# Patient Record
Sex: Female | Born: 1980
Health system: Southern US, Community
[De-identification: ages and names within clinical notes are randomized; demographics above are authoritative.]

## PROBLEM LIST (undated history)

## (undated) DIAGNOSIS — G43909 Migraine, unspecified, not intractable, without status migrainosus: Secondary | ICD-10-CM

## (undated) DIAGNOSIS — I10 Essential (primary) hypertension: Secondary | ICD-10-CM

## (undated) DIAGNOSIS — A6000 Herpesviral infection of urogenital system, unspecified: Secondary | ICD-10-CM

## (undated) HISTORY — PX: TUBAL LIGATION: SHX77

## (undated) HISTORY — DX: Herpesviral infection of urogenital system, unspecified: A60.00

---

## 2001-01-10 ENCOUNTER — Inpatient Hospital Stay (HOSPITAL_COMMUNITY): Admission: AD | Admit: 2001-01-10 | Discharge: 2001-01-13 | Payer: Self-pay | Admitting: Obstetrics and Gynecology

## 2001-02-19 ENCOUNTER — Other Ambulatory Visit: Admission: RE | Admit: 2001-02-19 | Discharge: 2001-02-19 | Payer: Self-pay | Admitting: Obstetrics and Gynecology

## 2002-02-12 ENCOUNTER — Encounter: Payer: Self-pay | Admitting: Emergency Medicine

## 2002-02-12 ENCOUNTER — Emergency Department (HOSPITAL_COMMUNITY): Admission: EM | Admit: 2002-02-12 | Discharge: 2002-02-12 | Payer: Self-pay | Admitting: Emergency Medicine

## 2002-03-04 ENCOUNTER — Other Ambulatory Visit: Admission: RE | Admit: 2002-03-04 | Discharge: 2002-03-04 | Payer: Self-pay | Admitting: Obstetrics and Gynecology

## 2002-03-15 ENCOUNTER — Inpatient Hospital Stay (HOSPITAL_COMMUNITY): Admission: AD | Admit: 2002-03-15 | Discharge: 2002-03-15 | Payer: Self-pay | Admitting: Obstetrics and Gynecology

## 2002-09-02 ENCOUNTER — Inpatient Hospital Stay (HOSPITAL_COMMUNITY): Admission: AD | Admit: 2002-09-02 | Discharge: 2002-09-04 | Payer: Self-pay | Admitting: Obstetrics and Gynecology

## 2003-08-19 ENCOUNTER — Other Ambulatory Visit: Admission: RE | Admit: 2003-08-19 | Discharge: 2003-08-19 | Payer: Self-pay | Admitting: Obstetrics and Gynecology

## 2004-02-29 ENCOUNTER — Inpatient Hospital Stay (HOSPITAL_COMMUNITY): Admission: AD | Admit: 2004-02-29 | Discharge: 2004-02-29 | Payer: Self-pay | Admitting: Obstetrics and Gynecology

## 2004-04-11 ENCOUNTER — Inpatient Hospital Stay (HOSPITAL_COMMUNITY): Admission: AD | Admit: 2004-04-11 | Discharge: 2004-04-13 | Payer: Self-pay | Admitting: Obstetrics and Gynecology

## 2004-04-12 ENCOUNTER — Encounter (INDEPENDENT_AMBULATORY_CARE_PROVIDER_SITE_OTHER): Payer: Self-pay | Admitting: *Deleted

## 2006-09-08 ENCOUNTER — Inpatient Hospital Stay (HOSPITAL_COMMUNITY): Admission: AD | Admit: 2006-09-08 | Discharge: 2006-09-09 | Payer: Self-pay | Admitting: Obstetrics and Gynecology

## 2007-03-02 ENCOUNTER — Emergency Department (HOSPITAL_COMMUNITY): Admission: EM | Admit: 2007-03-02 | Discharge: 2007-03-02 | Payer: Self-pay | Admitting: Emergency Medicine

## 2007-08-30 ENCOUNTER — Inpatient Hospital Stay (HOSPITAL_COMMUNITY): Admission: AD | Admit: 2007-08-30 | Discharge: 2007-08-30 | Payer: Self-pay | Admitting: Family Medicine

## 2008-07-28 ENCOUNTER — Inpatient Hospital Stay (HOSPITAL_COMMUNITY): Admission: AD | Admit: 2008-07-28 | Discharge: 2008-07-28 | Payer: Self-pay | Admitting: Obstetrics & Gynecology

## 2008-07-29 ENCOUNTER — Inpatient Hospital Stay (HOSPITAL_COMMUNITY): Admission: AD | Admit: 2008-07-29 | Discharge: 2008-07-29 | Payer: Self-pay | Admitting: Obstetrics & Gynecology

## 2010-05-10 LAB — URINE MICROSCOPIC-ADD ON

## 2010-05-10 LAB — URINALYSIS, ROUTINE W REFLEX MICROSCOPIC
Bilirubin Urine: NEGATIVE
Glucose, UA: NEGATIVE mg/dL
Hgb urine dipstick: NEGATIVE
Ketones, ur: NEGATIVE mg/dL
Nitrite: POSITIVE — AB
Protein, ur: NEGATIVE mg/dL
Specific Gravity, Urine: >1.03 — ABNORMAL HIGH (ref 1.005–1.030)
Urobilinogen, UA: 1 mg/dL (ref 0.0–1.0)
pH: 6 (ref 5.0–8.0)

## 2010-05-10 LAB — CBC
Hemoglobin: 14 g/dL (ref 12.0–15.0)
MCHC: 35.3 g/dL (ref 30.0–36.0)
MCV: 92 fL (ref 78.0–100.0)
RBC: 4.32 MIL/uL (ref 3.87–5.11)
RDW: 12.8 % (ref 11.5–15.5)

## 2010-05-10 LAB — GC/CHLAMYDIA PROBE AMP, GENITAL
Chlamydia, DNA Probe: POSITIVE — AB
GC Probe Amp, Genital: NEGATIVE

## 2010-05-10 LAB — WET PREP, GENITAL: Clue Cells Wet Prep HPF POC: NONE SEEN

## 2010-05-10 LAB — POCT PREGNANCY, URINE: Preg Test, Ur: NEGATIVE

## 2010-06-18 NOTE — Op Note (Signed)
[name], [name]               ACCOUNT NO.:  [id number]   MEDICAL RECORD NO.:  [id number]          PATIENT TYPE:  INP   LOCATION:  9102                          FACILITY:  [facility]   PHYSICIAN:  James A. Ashley Royalty, M.D.DATE OF BIRTH:  [birth year]   DATE OF PROCEDURE:  04/12/2004  DATE OF DISCHARGE:                                 OPERATIVE REPORT   PREOPERATIVE DIAGNOSIS:  Desire for attempt at permanent sterilization.   POSTOPERATIVE DIAGNOSIS:  Desire for attempt at permanent sterilization,  pathology pending.   PROCEDURE:  Bilateral tubal sterilization procedure (modified [place]).   SURGEON:  Rudy Jew. Ashley Royalty, M.D.   ANESTHESIA:  Epidural.   ESTIMATED BLOOD LOSS:  Less than 10 mL.   COMPLICATIONS:  None.   PACKS AND DRAINS:  None.   PROCEDURE:  The patient was taken to the operating room and placed in the  dorsal supine position.  Epidural anesthetic was administered to surgical  levels.  She was prepped and draped in the usual manner for abdominal  surgery.  A 2 cm infraumbilical incision was made.  The subcutaneous tissues  were sharply dissected down to the fascia, which was elevated with hemostats  and entered atraumatically with Mayo scissors.  The incision was extended  laterally.  The peritoneum was then encountered and elevated with hemostats  and entered atraumatically with Metzenbaum scissors.  S retractors were used  to expose the right fallopian tube, which was grasped with Babcock clamps  and traced to its fimbriated end.  An avascular area of the ampullary  portion of the tube was chosen for ligation.  A #1 plain catgut ligature was  securely tied around this knuckle of tube.  A second ligature was tied  securely as well.  The intervening portion of the fallopian tube was excised  with Metzenbaum scissors and sent to pathology for histologic studies.  Hemostasis was noted and the remainder of the fallopian tube allowed to  return to the abdominal cavity.  The  left fallopian tube was then identified  and elevated with Babcock clamps.  An avascular area at the ampullary area  was chosen for ligation.  A #1 plain ligature was tied securely around the  designated portion of the fallopian tube.  A second portion was tied as  well.  The intervening portion of fallopian tube was excised with Metzenbaum  scissors and sent to pathology for histologic studies.  Hemostasis was  noted.   At this point the fascia was closed with 0 Vicryl in a running fashion.  The  skin was closed with 3-0 Monocryl in a subcuticular fashion.  Hemostasis was  noted and the procedure terminated.   The patient tolerated the procedure extremely well and was returned to the  recovery room in excellent condition.      JAM/MEDQ  D:  04/12/2004  T:  04/12/2004  Job:  [id number]

## 2010-10-29 LAB — WET PREP, GENITAL: Yeast Wet Prep HPF POC: NONE SEEN

## 2010-10-29 LAB — URINALYSIS, ROUTINE W REFLEX MICROSCOPIC
Glucose, UA: NEGATIVE
Ketones, ur: NEGATIVE
Protein, ur: NEGATIVE
Urobilinogen, UA: 2 — ABNORMAL HIGH

## 2010-10-29 LAB — URINE MICROSCOPIC-ADD ON

## 2010-10-29 LAB — DIFFERENTIAL
Lymphocytes Relative: 49 — ABNORMAL HIGH
Lymphs Abs: 2.1
Monocytes Relative: 10
Neutro Abs: 1.5 — ABNORMAL LOW
Neutrophils Relative %: 35 — ABNORMAL LOW

## 2010-10-29 LAB — CBC
Hemoglobin: 13.4
RBC: 4.15
WBC: 4.3

## 2010-10-29 LAB — GC/CHLAMYDIA PROBE AMP, GENITAL: Chlamydia, DNA Probe: NEGATIVE

## 2010-10-29 LAB — URINE CULTURE

## 2010-11-15 LAB — URINALYSIS, ROUTINE W REFLEX MICROSCOPIC
Glucose, UA: NEGATIVE
Protein, ur: NEGATIVE
Specific Gravity, Urine: >1.03 — ABNORMAL HIGH
pH: 6

## 2010-11-15 LAB — WET PREP, GENITAL: Clue Cells Wet Prep HPF POC: NONE SEEN

## 2010-11-15 LAB — URINE CULTURE

## 2010-11-15 LAB — URINE MICROSCOPIC-ADD ON

## 2010-11-15 LAB — GC/CHLAMYDIA PROBE AMP, GENITAL: GC Probe Amp, Genital: NEGATIVE

## 2011-05-12 ENCOUNTER — Inpatient Hospital Stay (HOSPITAL_COMMUNITY)
Admission: AD | Admit: 2011-05-12 | Discharge: 2011-05-12 | Disposition: A | Discharge status: Home / Self Care | Payer: Self-pay | Source: Ambulatory Visit | Attending: Obstetrics and Gynecology | Admitting: Obstetrics and Gynecology

## 2011-05-12 DIAGNOSIS — Z3202 Encounter for pregnancy test, result negative: Secondary | ICD-10-CM | POA: Insufficient documentation

## 2011-05-12 DIAGNOSIS — N912 Amenorrhea, unspecified: Secondary | ICD-10-CM | POA: Insufficient documentation

## 2011-05-12 LAB — POCT PREGNANCY, URINE: Preg Test, Ur: NEGATIVE

## 2011-05-12 LAB — URINALYSIS, ROUTINE W REFLEX MICROSCOPIC
Glucose, UA: NEGATIVE mg/dL
pH: 7.5 (ref 5.0–8.0)

## 2011-05-12 LAB — URINE MICROSCOPIC-ADD ON

## 2011-05-12 NOTE — MAU Note (Signed)
Pt states had 3 positive upt's. Here for clarification. Denies pain, bleeding or abnormal vaginal discharge.

## 2011-05-12 NOTE — MAU Provider Note (Signed)
  History     CSN: [id number]  Arrival date and time: 05/12/11 1412   None     Chief Complaint  Patient presents with  . Amenorrhea   HPI Gina [name] is 31 y.o. presents with request for confirmation of pregnancy.   She had 3 + pregnancy tests at home.  LMP 1 week ago that was shorter than normal.   She has been trying to conceive for 2 months.  She denies pelvic pain.   No past medical history on file.  No past surgical history on file.  No family history on file.  History  Substance Use Topics  . Smoking status: Not on file  . Smokeless tobacco: Not on file  . Alcohol Use: Not on file    Allergies: Allergies not on file  No prescriptions prior to admission    Review of Systems  Constitutional: Negative.   Gastrointestinal: Negative for abdominal pain.  Genitourinary: Negative.   Psychiatric/Behavioral: Negative.    Physical Exam   Blood pressure 125/83, pulse 81, temperature 98.5 F (36.9 C), temperature source Oral, resp. rate 16, height 5' 5.5" (1.664 m), weight 94.065 kg (207 lb 6 oz), last menstrual period 03/31/2011, SpO2 100.00%.  Physical Exam  Constitutional: She is oriented to person, place, and time. She appears well-developed and well-nourished. No distress.  HENT:  Head: Normocephalic.  Genitourinary:       Not indicated  Neurological: She is alert and oriented to person, place, and time.  Skin: Skin is warm and dry.  Psychiatric: She has a normal mood and affect. Her behavior is normal.    MAU Course  Procedures  MDM   Assessment and Plan  A:  Abnormally short LMP       Negative UPT  P:  Patient instructed to repeat pregnancy test if she misses her next cycle.   Xayla Puzio,EVE M 05/12/2011, 3:49 PM

## 2011-05-16 NOTE — MAU Provider Note (Signed)
Agree with above note.  Ryett Hamman 05/16/2011 1:34 PM

## 2013-05-20 ENCOUNTER — Inpatient Hospital Stay (HOSPITAL_COMMUNITY)
Admission: AD | Admit: 2013-05-20 | Discharge: 2013-05-20 | Disposition: A | Discharge status: Home / Self Care | Payer: Self-pay | Source: Ambulatory Visit | Attending: Obstetrics & Gynecology | Admitting: Obstetrics & Gynecology

## 2013-05-20 DIAGNOSIS — H6520 Chronic serous otitis media, unspecified ear: Secondary | ICD-10-CM | POA: Insufficient documentation

## 2013-05-20 DIAGNOSIS — R030 Elevated blood-pressure reading, without diagnosis of hypertension: Secondary | ICD-10-CM | POA: Insufficient documentation

## 2013-05-20 DIAGNOSIS — IMO0001 Reserved for inherently not codable concepts without codable children: Secondary | ICD-10-CM

## 2013-05-20 DIAGNOSIS — H6590 Unspecified nonsuppurative otitis media, unspecified ear: Secondary | ICD-10-CM

## 2013-05-20 DIAGNOSIS — B009 Herpesviral infection, unspecified: Secondary | ICD-10-CM | POA: Insufficient documentation

## 2013-05-20 DIAGNOSIS — B001 Herpesviral vesicular dermatitis: Secondary | ICD-10-CM

## 2013-05-20 DIAGNOSIS — Z87891 Personal history of nicotine dependence: Secondary | ICD-10-CM | POA: Insufficient documentation

## 2013-05-20 MED ORDER — CETIRIZINE HCL 5 MG PO TABS: 5.0000 mg | ORAL_TABLET | Freq: Every day | ORAL | Status: DC

## 2013-05-20 MED ORDER — ACYCLOVIR 400 MG PO TABS: 400.0000 mg | ORAL_TABLET | Freq: Every day | ORAL | Status: DC

## 2013-05-20 NOTE — MAU Note (Signed)
Pt presents to MAU with complaints of a cold sore since last week and states she has tried multiple medications and nothing is helping

## 2013-05-20 NOTE — MAU Provider Note (Signed)
Attestation of Attending Supervision of Advanced Practitioner (CNM/NP): Evaluation and management procedures were performed by the Advanced Practitioner under my supervision and collaboration. I have reviewed the Advanced Practitioner's note and chart, and I agree with the management and plan.  Fredrich RomansKelly H Mickell Birdwell 4:05 PM

## 2013-05-20 NOTE — MAU Provider Note (Signed)
CC: Mouth Lesions    First Provider Initiated Contact with Patient 05/20/13 1306      HPI Gina GottronCeleste L [name] is a 33 y.o. G3P3  who presents with onset of cold sore lower lip 1 week ago. Has had cold sores before, sometimes in the same place. Has tried OTC cold sore remedies without relief. Also has a rough feeling area of right inner cheek and has difficulty hearing in left ear. Denies rhinorrhea, but thinks she has respiratory allergies.  Has had negative HIV test over a year ago.  Normal menses. No GYN complaints. No PCP.  No past medical history on file.  OB History  No data available    No past surgical history on file.  History   Social History  . Marital Status: Single    Spouse Name: N/A    Number of Children: N/A  . Years of Education: N/A   Occupational History  . Not on file.   Social History Main Topics  . Smoking status: Not on file  . Smokeless tobacco: Not on file  . Alcohol Use: Not on file  . Drug Use: Not on file  . Sexual Activity: Not on file   Other Topics Concern  . Not on file   Social History Narrative  . No narrative on file    No current facility-administered medications on file prior to encounter.   No current outpatient prescriptions on file prior to encounter.    Allergies not on file  ROS Pertinent items in HPI  PHYSICAL EXAM Filed Vitals:   05/20/13 1303  BP: 142/93  Pulse: 100  Temp: 98.5 F (36.9 C)  Resp: 18   General: Moderately obese female in no acute distress HEENT: mid left lower lip 3mm healing ulcerated lesion, no induration or redness Mouth: no lesions noted on buccal mucosa Ears: right TM normal, left TM dull with irregular LR Neck: no lymphadenopathyCardiovascular: Normal rate Respiratory: Normal effort Abdomen: Soft, nontender Back: No CVAT Extremities: No edema Neurologic: Alert and oriented   LAB RESULTS No results found for this or any previous visit (from the past 24 hour(s)).  IMAGING No  results found.  MAU COURSE   ASSESSMENT  1. Recurrent herpes labialis   2. Elevated BP   3. Serous otitis media     PLAN Discharge home. See AVS for patient education. Follow-up Information   Schedule an appointment as soon as possible [facility]. (As needed)    Contact information:   [address] [place] [id number] [phone]    Rx for acylovir to start prn within 1st hr of prodromal sx    Medication List         acyclovir 400 MG tablet  Commonly known as:  ZOVIRAX  Take 1 tablet (400 mg total) by mouth 5 (five) times daily.     cetirizine 5 MG tablet  Commonly known as:  ZYRTEC  Take 1 tablet (5 mg total) by mouth daily.       AVS on safe sex and cold sore and Uptodate pt information printed anad reviewed. Follow-up Information   Schedule an appointment as soon as [facility]. (As needed)    Contact information:   [address] [place] [address] [phone]       Danae OrleansDeirdre C Ordell Prichett, CNM 05/20/2013 1:24 PM

## 2013-05-20 NOTE — Discharge Instructions (Signed)
Cold Sore  A cold sore (fever blister) is a skin infection caused by the herpes simplex virus (HSV-1). HSV-1 is closely related to the virus that causes gential herpes (HSV-2), but they are not the same even though both viruses can cause oral and genital infections. Cold sores are small, fluid-filled sores inside of the mouth or on the lips, gums, nose, chin, cheeks, or fingers.   The herpes simplex virus can be easily passed (contagious) to other people through close personal contact, such as kissing or sharing personal items. The virus can also spread to other parts of the body, such as the eyes or genitals. Cold sores are contagious until the sores crust over completely. They often heal within 2 weeks.   Once a person is infected, the herpes simplex virus remains permanently in the body. Therefore, there is no cure for cold sores, and they often recur when a person is tired, stressed, sick, or gets too much sun. Additional factors that can cause a recurrence include hormone changes in menstruation or pregnancy, certain drugs, and cold weather.   CAUSES   Cold sores are caused by the herpes simplex virus. The virus is spread from person to person through close contact, such as through kissing, touching the affected area, or sharing personal items such as lip balm, razors, or eating utensils.   SYMPTOMS   The first infection may not cause symptoms. If symptoms develop, the symptoms often go through different stages. Here is how a cold sore develops:   · Tingling, itching, or burning is felt 1 2 days before the outbreak.    · Fluid-filled blisters appear on the lips, inside the mouth, nose, or on the cheeks.    · The blisters start to ooze clear fluid.    · The blisters dry up and a yellow crust appears in its place.    · The crust falls off.    Symptoms depend on whether it is the initial outbreak or a recurrence. Some other symptoms with the first outbreak may include:   · Fever.    · Sore throat.    · Headache.     · Muscle aches.    · Swollen neck glands.    DIAGNOSIS   A diagnosis is often made based on your symptoms and looking at the sores. Sometimes, a sore may be swabbed and then examined in the lab to make a final diagnosis. If the sores are not present, blood tests can find the herpes simplex virus.   TREATMENT   There is no cure for cold sores and no vaccine for the herpes simplex virus. Within 2 weeks, most cold sores go away on their own without treatment. Medicines cannot make the infection go away, but medicine can help relieve some of the pain associated with the sores, can work to stop the virus from multiplying, and can also shorten healing time. Medicine may be in the form of creams, gels, pills, or a shot.   HOME CARE INSTRUCTIONS   · Only take over-the-counter or prescription medicines for pain, discomfort, or fever as directed by your caregiver. Do not use aspirin.    · Use a cotton-tip swab to apply creams or gels to your sores.    · Do not touch the sores or pick the scabs. Wash your hands often. Do not touch your eyes without washing your hands first.    · Avoid kissing, oral sex, and sharing personal items until sores heal.    · Apply an ice pack on your sores for 10 15 minutes to ease any   discomfort.    · Avoid hot, cold, or salty foods because they may hurt your mouth. Eat a soft, bland diet to avoid irritating the sores. Use a straw to drink if you have pain when drinking out of a glass.    · Keep sores clean and dry to prevent an infection of other tissues.    · Avoid the sun and limit stress if these things trigger outbreaks. If sun causes cold sores, apply sunscreen on the lips before being out in the sun.    SEEK MEDICAL CARE IF:   · You have a fever or persistent symptoms for more than 2 3 days.    · You have a fever and your symptoms suddenly get worse.    · You have pus, not clear fluid, coming from the sores.    · You have redness that is spreading.    · You have pain or irritation in your  eye.    · You get sores on your genitals.    · Your sores do not heal within 2 weeks.    · You have a weakened immune system.    · You have frequent recurrences of cold sores.    MAKE SURE YOU:   · Understand these instructions.  · Will watch your condition.  · Will get help right away if you are not doing well or get worse.  Document Released: 01/15/2000 Document Revised: 10/12/2011 Document Reviewed: 06/01/2011  [employer]® Patient Information ©2014 [employer].

## 2014-09-19 ENCOUNTER — Emergency Department (HOSPITAL_COMMUNITY)
Admission: EM | Admit: 2014-09-19 | Discharge: 2014-09-19 | Disposition: A | Discharge status: Home / Self Care | Payer: Medicaid Other | Attending: Emergency Medicine | Admitting: Emergency Medicine

## 2014-09-19 ENCOUNTER — Emergency Department (HOSPITAL_COMMUNITY): Payer: Medicaid Other

## 2014-09-19 ENCOUNTER — Encounter (HOSPITAL_COMMUNITY): Payer: Self-pay | Admitting: *Deleted

## 2014-09-19 DIAGNOSIS — A599 Trichomoniasis, unspecified: Secondary | ICD-10-CM

## 2014-09-19 DIAGNOSIS — R079 Chest pain, unspecified: Secondary | ICD-10-CM | POA: Insufficient documentation

## 2014-09-19 DIAGNOSIS — I10 Essential (primary) hypertension: Secondary | ICD-10-CM | POA: Diagnosis not present

## 2014-09-19 DIAGNOSIS — A5901 Trichomonal vulvovaginitis: Secondary | ICD-10-CM | POA: Diagnosis not present

## 2014-09-19 DIAGNOSIS — Z7982 Long term (current) use of aspirin: Secondary | ICD-10-CM | POA: Insufficient documentation

## 2014-09-19 DIAGNOSIS — N390 Urinary tract infection, site not specified: Secondary | ICD-10-CM | POA: Insufficient documentation

## 2014-09-19 HISTORY — DX: Essential (primary) hypertension: I10

## 2014-09-19 LAB — URINALYSIS, ROUTINE W REFLEX MICROSCOPIC
BILIRUBIN URINE: NEGATIVE
GLUCOSE, UA: NEGATIVE mg/dL
KETONES UR: NEGATIVE mg/dL
NITRITE: POSITIVE — AB
PH: 6.5 (ref 5.0–8.0)
PROTEIN: NEGATIVE mg/dL
Specific Gravity, Urine: 1.019 (ref 1.005–1.030)
Urobilinogen, UA: 0.2 mg/dL (ref 0.0–1.0)

## 2014-09-19 LAB — BASIC METABOLIC PANEL
ANION GAP: 7 (ref 5–15)
BUN: 11 mg/dL (ref 6–20)
CALCIUM: 9.1 mg/dL (ref 8.9–10.3)
CHLORIDE: 107 mmol/L (ref 101–111)
CO2: 25 mmol/L (ref 22–32)
CREATININE: 0.89 mg/dL (ref 0.44–1.00)
GFR calc non Af Amer: >60 mL/min (ref >60)
Glucose, Bld: 76 mg/dL (ref 65–99)
Potassium: 3.7 mmol/L (ref 3.5–5.1)
SODIUM: 139 mmol/L (ref 135–145)

## 2014-09-19 LAB — CBG MONITORING, ED: Glucose-Capillary: 65 mg/dL (ref 65–99)

## 2014-09-19 LAB — CBC
HEMATOCRIT: 30.7 % — AB (ref 36.0–46.0)
HEMOGLOBIN: 10.1 g/dL — AB (ref 12.0–15.0)
MCH: 27.7 pg (ref 26.0–34.0)
MCHC: 32.9 g/dL (ref 30.0–36.0)
MCV: 84.1 fL (ref 78.0–100.0)
Platelets: 282 10*3/uL (ref 150–400)
RBC: 3.65 MIL/uL — ABNORMAL LOW (ref 3.87–5.11)
RDW: 14.1 % (ref 11.5–15.5)
WBC: 6.4 10*3/uL (ref 4.0–10.5)

## 2014-09-19 LAB — I-STAT TROPONIN, ED
TROPONIN I, POC: 0 ng/mL (ref 0.00–0.08)
Troponin i, poc: 0 ng/mL (ref 0.00–0.08)

## 2014-09-19 LAB — RAPID URINE DRUG SCREEN, HOSP PERFORMED
Amphetamines: NOT DETECTED
BARBITURATES: NOT DETECTED
Benzodiazepines: NOT DETECTED
Cocaine: NOT DETECTED
Opiates: NOT DETECTED
TETRAHYDROCANNABINOL: NOT DETECTED

## 2014-09-19 LAB — URINE MICROSCOPIC-ADD ON

## 2014-09-19 MED ORDER — AZITHROMYCIN 250 MG PO TABS
1000.0000 mg | ORAL_TABLET | Freq: Once | ORAL | Status: AC
  Administered 2014-09-19: 1000 mg via ORAL
  Filled 2014-09-19: qty 4

## 2014-09-19 MED ORDER — METOCLOPRAMIDE HCL 5 MG/ML IJ SOLN
10.0000 mg | Freq: Once | INTRAMUSCULAR | Status: AC
  Administered 2014-09-19: 10 mg via INTRAVENOUS
  Filled 2014-09-19: qty 2

## 2014-09-19 MED ORDER — LIDOCAINE HCL (PF) 1 % IJ SOLN
INTRAMUSCULAR | Status: AC
  Filled 2014-09-19: qty 5

## 2014-09-19 MED ORDER — ONDANSETRON HCL 4 MG/2ML IJ SOLN
4.0000 mg | Freq: Once | INTRAMUSCULAR | Status: AC
  Administered 2014-09-19: 4 mg via INTRAVENOUS
  Filled 2014-09-19: qty 2

## 2014-09-19 MED ORDER — CEPHALEXIN 500 MG PO CAPS: 500.0000 mg | ORAL_CAPSULE | Freq: Four times a day (QID) | ORAL | Status: DC

## 2014-09-19 MED ORDER — METRONIDAZOLE 500 MG PO TABS
2000.0000 mg | ORAL_TABLET | Freq: Once | ORAL | Status: AC
  Administered 2014-09-19: 2000 mg via ORAL
  Filled 2014-09-19: qty 4

## 2014-09-19 MED ORDER — MORPHINE SULFATE (PF) 4 MG/ML IV SOLN
2.0000 mg | Freq: Once | INTRAVENOUS | Status: AC
  Administered 2014-09-19: 2 mg via INTRAVENOUS
  Filled 2014-09-19: qty 1

## 2014-09-19 MED ORDER — CEFTRIAXONE SODIUM 250 MG IJ SOLR
250.0000 mg | Freq: Once | INTRAMUSCULAR | Status: AC
  Administered 2014-09-19: 250 mg via INTRAMUSCULAR
  Filled 2014-09-19: qty 250

## 2014-09-19 MED ORDER — ONDANSETRON HCL 4 MG PO TABS: 4.0000 mg | ORAL_TABLET | Freq: Three times a day (TID) | ORAL | Status: DC | PRN

## 2014-09-19 MED ORDER — SODIUM CHLORIDE 0.9 % IV SOLN
1000.0000 mL | Freq: Once | INTRAVENOUS | Status: AC
  Administered 2014-09-19: 1000 mL via INTRAVENOUS

## 2014-09-19 MED ORDER — CEPHALEXIN 250 MG PO CAPS
500.0000 mg | ORAL_CAPSULE | Freq: Once | ORAL | Status: AC
  Administered 2014-09-19: 500 mg via ORAL
  Filled 2014-09-19: qty 2

## 2014-09-19 MED ORDER — DIPHENHYDRAMINE HCL 50 MG/ML IJ SOLN
25.0000 mg | Freq: Once | INTRAMUSCULAR | Status: AC
  Administered 2014-09-19: 25 mg via INTRAVENOUS
  Filled 2014-09-19: qty 1

## 2014-09-19 NOTE — ED Notes (Signed)
Pt has recurrent chest pain X 2 years.  Pt was at work today and began to have CP at 11:00 am.. She described the CP as a 10/10 pressure that was in her left chest.  She felt weak, light headed, and began to sweat.  She was given 324 mg of ASA, 3 nitro and a 500cc bolus in route.  No pain presently.  VS are as follows: BP: 115/74 HR: 90 CBG: 113

## 2014-09-19 NOTE — ED Notes (Signed)
Pt. Is back in room. 
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
## 2014-09-19 NOTE — ED Notes (Signed)
Pt left at this time with all belongings.  

## 2014-09-19 NOTE — ED Notes (Signed)
Pt. Is going to x-ray. 

## 2014-09-19 NOTE — Discharge Instructions (Signed)
Chest Wall Pain °Chest wall pain is pain in or around the bones and muscles of your chest. It may take up to 6 weeks to get better. It may take longer if you must stay physically active in your work and activities.  °CAUSES  °Chest wall pain may happen on its own. However, it may be caused by: °· A viral illness like the flu. °· Injury. °· Coughing. °· Exercise. °· Arthritis. °· Fibromyalgia. °· Shingles. °HOME CARE INSTRUCTIONS  °· Avoid overtiring physical activity. Try not to strain or perform activities that cause pain. This includes any activities using your chest or your abdominal and side muscles, especially if heavy weights are used. °· Put ice on the sore area. °¨ Put ice in a plastic bag. °¨ Place a towel between your skin and the bag. °¨ Leave the ice on for 15-20 minutes per hour while awake for the first 2 days. °· Only take over-the-counter or prescription medicines for pain, discomfort, or fever as directed by your caregiver. °SEEK IMMEDIATE MEDICAL CARE IF:  °· Your pain increases, or you are very uncomfortable. °· You have a fever. °· Your chest pain becomes worse. °· You have new, unexplained symptoms. °· You have nausea or vomiting. °· You feel sweaty or lightheaded. °· You have a cough with phlegm (sputum), or you cough up blood. °MAKE SURE YOU:  °· Understand these instructions. °· Will watch your condition. °· Will get help right away if you are not doing well or get worse. °Document Released: 01/17/2005 Document Revised: 04/11/2011 Document Reviewed: 09/13/2010 °ExitCare® Patient Information ©2015 [employer]. This information is not intended to replace advice given to you by your health care provider. Make sure you discuss any questions you have with your health care provider. °Urinary Tract Infection °Urinary tract infections (UTIs) can develop anywhere along your urinary tract. Your urinary tract is your body's drainage system for removing wastes and extra water. Your urinary tract  includes two kidneys, two ureters, a bladder, and a urethra. Your kidneys are a pair of bean-shaped organs. Each kidney is about the size of your fist. They are located below your ribs, one on each side of your spine. °CAUSES °Infections are caused by microbes, which are microscopic organisms, including fungi, viruses, and bacteria. These organisms are so small that they can only be seen through a microscope. Bacteria are the microbes that most commonly cause UTIs. °SYMPTOMS  °Symptoms of UTIs may vary by age and gender of the patient and by the location of the infection. Symptoms in young women typically include a frequent and intense urge to urinate and a painful, burning feeling in the bladder or urethra during urination. Older women and men are more likely to be tired, shaky, and weak and have muscle aches and abdominal pain. A fever may mean the infection is in your kidneys. Other symptoms of a kidney infection include pain in your back or sides below the ribs, nausea, and vomiting. °DIAGNOSIS °To diagnose a UTI, your caregiver will ask you about your symptoms. Your caregiver also will ask to provide a urine sample. The urine sample will be tested for bacteria and white blood cells. White blood cells are made by your body to help fight infection. °TREATMENT  °Typically, UTIs can be treated with medication. Because most UTIs are caused by a bacterial infection, they usually can be treated with the use of antibiotics. The choice of antibiotic and length of treatment depend on your symptoms and the type of   of bacteria causing your infection. HOME CARE INSTRUCTIONS  If you were prescribed antibiotics, take them exactly as your caregiver instructs you. Finish the medication even if you feel better after you have only taken some of the medication.  Drink enough water and fluids to keep your urine clear or pale yellow.  Avoid caffeine, tea, and carbonated beverages. They tend to irritate your bladder.  Empty  your bladder often. Avoid holding urine for long periods of time.  Empty your bladder before and after sexual intercourse.  After a bowel movement, women should cleanse from front to back. Use each tissue only once. SEEK MEDICAL CARE IF:   You have back pain.  You develop a fever.  Your symptoms do not begin to resolve within 3 days. SEEK IMMEDIATE MEDICAL CARE IF:   You have severe back pain or lower abdominal pain.  You develop chills.  You have nausea or vomiting.  You have continued burning or discomfort with urination. MAKE SURE YOU:   Understand these instructions.  Will watch your condition.  Will get help right away if you are not doing well or get worse. Document Released: 10/27/2004 Document Revised: 07/19/2011 Document Reviewed: 02/25/2011 [facility] Patient Information 2015 [place], [place]. This information is not intended to replace advice given to you by your health care provider. Make sure you discuss any questions you have with your health care provider.  Trichomoniasis Trichomoniasis is an infection caused by an organism called Trichomonas. The infection can affect both women and men. In women, the outer female genitalia and the vagina are affected. In men, the penis is mainly affected, but the prostate and other reproductive organs can also be involved. Trichomoniasis is a sexually transmitted infection (STI) and is most often passed to another person through sexual contact.  RISK FACTORS  Having unprotected sexual intercourse.  Having sexual intercourse with an infected partner. SIGNS AND SYMPTOMS  Symptoms of trichomoniasis in women include:  Abnormal gray-green frothy vaginal discharge.  Itching and irritation of the vagina.  Itching and irritation of the area outside the vagina. Symptoms of trichomoniasis in men include:   Penile discharge with or without pain.  Pain during urination. This results from inflammation of the urethra. DIAGNOSIS    Trichomoniasis may be found during a Pap test or physical exam. Your health care provider may use one of the following methods to help diagnose this infection:  Examining vaginal discharge under a microscope. For men, urethral discharge would be examined.  Testing the pH of the vagina with a test tape.  Using a vaginal swab test that checks for the Trichomonas organism. A test is available that provides results within a few minutes.  Doing a culture test for the organism. This is not usually needed. TREATMENT   You may be given medicine to fight the infection. Women should inform their health care provider if they could be or are pregnant. Some medicines used to treat the infection should not be taken during pregnancy.  Your health care provider may recommend over-the-counter medicines or creams to decrease itching or irritation.  Your sexual partner will need to be treated if infected. HOME CARE INSTRUCTIONS   Take medicines only as directed by your health care provider.  Take over-the-counter medicine for itching or irritation as directed by your health care provider.  Do not have sexual intercourse while you have the infection.  Women should not douche or wear tampons while they have the infection.  Discuss your infection with your partner. Your  partner may have gotten the infection from you, or you may have gotten it from your partner.  Have your sex partner get examined and treated if necessary.  Practice safe, informed, and protected sex.  See your health care provider for other STI testing. SEEK MEDICAL CARE IF:   You still have symptoms after you finish your medicine.  You develop abdominal pain.  You have pain when you urinate.  You have bleeding after sexual intercourse.  You develop a rash.  Your medicine makes you sick or makes you throw up (vomit). MAKE SURE YOU:  Understand these instructions.  Will watch your condition.  Will get help right away if  you are not doing well or get worse. Document Released: 07/13/2000 Document Revised: 06/03/2013 Document Reviewed: 10/29/2012 [facility] Patient Information 2015 [place], [place]. This information is not intended to replace advice given to you by your health care provider. Make sure you discuss any questions you have with your health care provider.   Emergency Department Resource Guide 1) Find a Doctor and Pay Out of Pocket Although you won't have to find out who is covered by your insurance plan, it is a good idea to ask around and get recommendations. You will then need to call the office and see if the doctor you have chosen will accept you as a new patient and what types of options they offer for patients who are self-pay. Some doctors offer discounts or will set up payment plans for their patients who do not have insurance, but you will need to ask so you aren't surprised when you get to your appointment.  2) Contact Your Local Health Department Not all health departments have doctors that can see patients for sick visits, but many do, so it is worth a call to see if yours does. If you don't know where your local health department is, you can check in your phone book. The CDC also has a tool to help you locate your state's health department, and many state websites also have listings of all of their local health departments.  3) Find a Walk-in Clinic If your illness is not likely to be very severe or complicated, you may want to try a walk in clinic. These are popping up all over the country in pharmacies, drugstores, and shopping centers. They're usually staffed by nurse practitioners or physician assistants that have been trained to treat common illnesses and complaints. They're usually fairly quick and inexpensive. However, if you have serious medical issues or chronic medical problems, these are probably not your best option.  No Primary Care Doctor: - Call Health Connect at  [phone] - they  can help you locate a primary care doctor that  accepts your insurance, provides certain services, etc. - Physician Referral Service- [phone]  Chronic Pain Problems: Organization         Address  Phone   Notes  [facility]  [phone] Patients need to be referred by their primary care doctor.   Medication Assistance: Organization         Address  Phone   Notes  [facility] Medication [facility] [address] [place] [place], [place] [address] [phone] --Must be a resident of [facility] -- Must have NO insurance coverage whatsoever (no Medicaid/ Medicare, etc.) -- The pt. MUST have a primary care doctor that directs their care regularly and follows them in the community   [facility]ist  [phone]   [address]  (  [phone]    Agencies that provide inexpensive medical care: Organization         Address  Phone   Notes  [place]  [phone]   [facility]    [phone]   [facility] [place], [place] [address] [phone]   [place] [address], [place] [phone]   Planned Parenthood    [phone]   [facility]    [phone]   [place] [address], [place] Phone:  [phone], Fax:  [phone] Hours of Operation:  9 am - 6 pm, M-F.  Also accepts Medicaid/Medicare and self-pay.  [facility] for [place], [address], [place] Phone: [phone], Fax: [phone]. Hours of Operation:  8:30 am - 5:30 pm, M-F.  Also accepts Medicaid and self-pay.  [facility] [address] [place] Phone: [phone]   [place], [place], [place] [phone], Ext. [phone] Mondays & Thursdays: 7-9 AM.  First 15 patients are seen on a first come, first serve basis.    [place] Providers:  Organization         Address  Phone   Notes  [facility] [address],  [phone] Also accepts self-pay patients.  [facility] [address], [place]  [phone]   [place], [address], [place] [phone]   [facility] [address] [place] [phone]   Lucianne Lei [address], [place]   [phone] Only accepts [place] Access [place] patients after they have their name applied to their card.   Self-Pay (no insurance) in [facility]:  Organization         Address  Phone   Notes  Sickle Cell Patients, [facility] [place] [phone]   [facility] [place] [phone]   [facility] [place]  [place], [place], [place] [phone]   [facility]/Dr. Osei-Bonsu  [address], [place] or [address], [place] [phone] Phone number for both [place] and [place] locations is the same.  Urgent Medical and [facility] [address] [place] [phone]   [facility] [address] [place] or [address] [phone]   [facility] [address] [place] [phone], phone; [phone], fax Sees patients 1st and 3rd Saturday of every month.  Must not qualify for public or private insurance (i.e. Medicaid, Medicare, [place] Health Choice, Veterans' Benefits)  Household income should be no more than 200% of the poverty level The clinic cannot treat you if you are pregnant or think you are pregnant  Sexually transmitted diseases are not treated at the clinic.    Dental Care: Organization         Address  Phone  Notes  [facility] Department of [facility] [place] [phone] Accepts children up to age 66 who are enrolled in [place] or [place]; pregnant women with a Medicaid card; and children who  have applied for Medicaid or [place] Health Choice, but were declined, whose parents can pay a reduced fee at time of service.  [facility] Department of [facility]  [address], [place] [phone] Accepts children up to age 76 who are enrolled in [place] or [place]; pregnant women with a Medicaid card; and children who have applied for Medicaid or  Health Choice, but were declined, whose parents can pay a reduced fee at time of service.  [facility] Adult [facility] Access PRO[facility]  [place] [phone] Patients are seen by appointment only. Walk-ins are not accepted. [place] will see patients 88 years of age and older. Monday - Tuesday (8am-5pm) Most Wednesdays (8:30-5pm) $30 per visit, cash only  [facility] Adult [facility] Access PROGRAM  [address], [facility] [phone] Patients are seen by appointment only. Walk-ins are not accepted. [place] will see patients 41 years of age and older. One Wednesday Evening (Monthly: Volunteer Based).  $30 per visit, cash only  [place]  [phone] for adults; Children under age 63, call Graduate Pediatric Dentistry at [phone]. Children aged 50-14, please call [phone] to request a pediatric application.  Dental services are provided in all areas of dental care including fillings, crowns and bridges, complete and partial dentures, implants, gum treatment, root canals, and extractions. Preventive care is also provided. Treatment is provided to both adults and children. Patients are selected via a lottery and there is often a waiting list.   [facility] [address] [place]  [phone] [email or web address]   Rescue  [facility] [address] [place], [place] [phone], Ext. [phone] Second and Fourth Thursday of each month, opens at 6:30 AM; Clinic ends at 9 AM.  Patients are seen on a first-come first-served basis, and a limited number are seen during each clinic.   [facility]  [address] Hillard Danker [place], [place] [phone]   Eligibility Requirements You must have lived in [place], [place], or [place] counties for at least the last three months.   You cannot be eligible for state or federal sponsored [employer], including [employer], [place], or [employer].   You generally cannot be eligible for healthcare insurance through your employer.    How to apply: Eligibility screenings are held every Tuesday and Wednesday afternoon from 1:00 pm until 4:00 pm. You do not need an appointment for the interview!  [facility] [address], [place], [place]   [place]  [place] Department  [place]  [phone]    Behavioral Health Resources in the Community: Intensive Outpatient Programs Organization         Address  Phone  Notes  [place]. [address], [place], [place] [phone]   [facility] Outpatient [address], [place], [place]   ADS: [facility] [address] [place], [place]   [place] [address]  [place], [place] or [phone]   Substance Abuse Resources Organization         Address  Phone  Notes  Alcohol and Drug Services  [place]  [phone]   [facility] [place]   Chinita Pester  [phone]   Residential & Outpatient Substance  Abuse Program  9515162152   Psychological Services Organization         Address  Phone  Notes  [place]   [place]  [phone]   [place] [address] [place] or [phone]    Mobile Crisis Teams Organization         Address  Phone  Notes  [facility], Mobile Crisis Care Unit  [phone]   [facility]  [address] [place], [place]   [place] Levels [address], [place] [phone]    Self-Help/Support Groups Organization         Address  Phone             Notes  [place]. of [place] - variety of support groups  [place] Call for more information  Narcotics Anonymous (NA), [facility] [address] [place] [address], [employer] [place]  2 meetings at this location   Special educational needs teacher         Address  Phone  Notes  ASAP Residential Treatment [place],    [place]  [phone]   [facility]  [address] [place] [id number], [place], [place]   [place], [place] [phone] Admissions: 8am-3pm M-F  Incentives Substance [place] [address]    [place], [place] [phone]   [facility] [address] [place], [place], [place]   [facility] [address],  [place], [place]   [facility] - Intensive Outpatient [address], [place], [place]   [facility] [place] ([place] [place].,  [place], [place] [phone] or [phone]   [facility] ([facility]) [address] [place], [place] Accepts Medicaid  [facility] [place] [address]  [place] [phone] Substance Abuse/Addiction Treatment   [facility] Organization         Address  Phone  Notes  [facility]  [phone]   Domenic Schwab, PhD [address] Arlis Porta [place], [place]   [phone] or  [phone]   [place], [place] [phone]   [facility] [phone] [place], [place] [phone] Insurance/Medicaid/sponsorship through [facility] Families [address]                                    [place], [place] [phone] [place] [address] [place], [place] [phone]    Dr. Adele Schilder  [phone]   [facility] Dept. 1) [address] [place] 2) [place] 3)  [address], [place] [phone]  [phone]   [place] [phone] or [phone] (After Hours)

## 2014-10-31 ENCOUNTER — Ambulatory Visit: Payer: Medicaid Other | Admitting: Family Medicine

## 2014-12-09 ENCOUNTER — Ambulatory Visit: Payer: Medicaid Other | Admitting: Family Medicine

## 2015-01-05 ENCOUNTER — Ambulatory Visit (INDEPENDENT_AMBULATORY_CARE_PROVIDER_SITE_OTHER): Payer: Medicaid Other | Admitting: Family Medicine

## 2015-01-05 ENCOUNTER — Encounter: Payer: Self-pay | Admitting: Family Medicine

## 2015-01-05 VITALS — BP 102/64 | HR 68 | Temp 97.7°F | Ht 67.0 in | Wt 212.0 lb

## 2015-01-05 DIAGNOSIS — A6000 Herpesviral infection of urogenital system, unspecified: Secondary | ICD-10-CM | POA: Diagnosis not present

## 2015-01-05 DIAGNOSIS — J302 Other seasonal allergic rhinitis: Secondary | ICD-10-CM | POA: Insufficient documentation

## 2015-01-05 DIAGNOSIS — R82998 Other abnormal findings in urine: Secondary | ICD-10-CM

## 2015-01-05 DIAGNOSIS — Z23 Encounter for immunization: Secondary | ICD-10-CM

## 2015-01-05 DIAGNOSIS — R519 Headache, unspecified: Secondary | ICD-10-CM | POA: Insufficient documentation

## 2015-01-05 DIAGNOSIS — E669 Obesity, unspecified: Secondary | ICD-10-CM | POA: Diagnosis not present

## 2015-01-05 DIAGNOSIS — Z8679 Personal history of other diseases of the circulatory system: Secondary | ICD-10-CM | POA: Insufficient documentation

## 2015-01-05 DIAGNOSIS — G44209 Tension-type headache, unspecified, not intractable: Secondary | ICD-10-CM

## 2015-01-05 DIAGNOSIS — R51 Headache: Secondary | ICD-10-CM

## 2015-01-05 DIAGNOSIS — F411 Generalized anxiety disorder: Secondary | ICD-10-CM

## 2015-01-05 DIAGNOSIS — N390 Urinary tract infection, site not specified: Secondary | ICD-10-CM

## 2015-01-05 LAB — CBC WITH DIFFERENTIAL/PLATELET
BASOS ABS: 0 10*3/uL (ref 0.0–0.1)
Basophils Relative: 0 % (ref 0–1)
EOS ABS: 0.1 10*3/uL (ref 0.0–0.7)
EOS PCT: 2 % (ref 0–5)
HEMATOCRIT: 37.7 % (ref 36.0–46.0)
Hemoglobin: 12.4 g/dL (ref 12.0–15.0)
LYMPHS ABS: 2.3 10*3/uL (ref 0.7–4.0)
LYMPHS PCT: 48 % — AB (ref 12–46)
MCH: 28.1 pg (ref 26.0–34.0)
MCHC: 32.9 g/dL (ref 30.0–36.0)
MCV: 85.5 fL (ref 78.0–100.0)
MONOS PCT: 8 % (ref 3–12)
MPV: 10.5 fL (ref 8.6–12.4)
Monocytes Absolute: 0.4 10*3/uL (ref 0.1–1.0)
NEUTROS PCT: 42 % — AB (ref 43–77)
Neutro Abs: 2 10*3/uL (ref 1.7–7.7)
PLATELETS: 312 10*3/uL (ref 150–400)
RBC: 4.41 MIL/uL (ref 3.87–5.11)
RDW: 14.9 % (ref 11.5–15.5)
WBC: 4.7 10*3/uL (ref 4.0–10.5)

## 2015-01-05 LAB — COMPLETE METABOLIC PANEL WITH GFR
ALBUMIN: 4.6 g/dL (ref 3.6–5.1)
ALT: 17 U/L (ref 6–29)
AST: 19 U/L (ref 10–30)
Alkaline Phosphatase: 33 U/L (ref 33–115)
BILIRUBIN TOTAL: 0.5 mg/dL (ref 0.2–1.2)
BUN: 10 mg/dL (ref 7–25)
CALCIUM: 9.2 mg/dL (ref 8.6–10.2)
CO2: 24 mmol/L (ref 20–31)
CREATININE: 0.8 mg/dL (ref 0.50–1.10)
Chloride: 99 mmol/L (ref 98–110)
GFR, Est Non African American: >89 mL/min (ref >=60)
Glucose, Bld: 77 mg/dL (ref 65–99)
Potassium: 3.9 mmol/L (ref 3.5–5.3)
Sodium: 134 mmol/L — ABNORMAL LOW (ref 135–146)
TOTAL PROTEIN: 7.6 g/dL (ref 6.1–8.1)

## 2015-01-05 LAB — POCT URINALYSIS DIP (DEVICE)
BILIRUBIN URINE: NEGATIVE
GLUCOSE, UA: NEGATIVE mg/dL
KETONES UR: NEGATIVE mg/dL
NITRITE: NEGATIVE
PH: 5.5 (ref 5.0–8.0)
PROTEIN: NEGATIVE mg/dL
Specific Gravity, Urine: >=1.03 (ref 1.005–1.030)
Urobilinogen, UA: 0.2 mg/dL (ref 0.0–1.0)

## 2015-01-05 LAB — LIPID PANEL
Cholesterol: 250 mg/dL — ABNORMAL HIGH (ref 125–200)
HDL: 46 mg/dL (ref >=46)
LDL CALC: 178 mg/dL — AB (ref <130)
Total CHOL/HDL Ratio: 5.4 Ratio — ABNORMAL HIGH (ref <=5.0)
Triglycerides: 129 mg/dL (ref <150)
VLDL: 26 mg/dL (ref <30)

## 2015-01-05 LAB — HEMOGLOBIN A1C
HEMOGLOBIN A1C: 5.6 % (ref <5.7)
MEAN PLASMA GLUCOSE: 114 mg/dL (ref <117)

## 2015-01-05 LAB — TSH: TSH: 1.25 u[IU]/mL (ref 0.350–4.500)

## 2015-01-05 MED ORDER — BUTALBITAL-APAP-CAFFEINE 50-325-40 MG PO TABS: 1.0000 | ORAL_TABLET | Freq: Four times a day (QID) | ORAL | Status: DC | PRN

## 2015-01-05 MED ORDER — CETIRIZINE HCL 5 MG PO TABS: 5.0000 mg | ORAL_TABLET | Freq: Every day | ORAL | Status: DC

## 2015-01-05 MED ORDER — VALACYCLOVIR HCL 1 G PO TABS: 1000.0000 mg | ORAL_TABLET | Freq: Every day | ORAL | Status: DC

## 2015-01-05 NOTE — Progress Notes (Signed)
Subjective:    Patient ID: Gina [name], female    DOB: [birth year], 34 y.o.   MRN: [id number]  HPI Ms. Gina DeedsCeleste [name], a 34 year old female presents to establish care. She reporgts that she has not had a primary provider over the past several years. She states that she has a history of hypertension. She has been on anti-hypertensive medications in the past. She is currently without medication. She does not exercise or follow a low sodium diet. She denies chest pains, palpitations, lower extremity edema, or shortness of breath.  Patient is complaining of daily headaches.  Symptoms began about several years ago and typically occur daily. Generally the headache lasts several hours and is primarily to the front of head bilaterally. . The headache do not seem to be related to any time of the day. The headaches are usually dull and are bilateral in location.  The patient rates her most severe headaches a 7 on a scale from 1 to 10. Recently, the headaches are increasing in frequency.  No precipitating factors have been identified. Ms. Chestine SporeClark denies depression, dizziness, loss of balance, muscle weakness, numbness of extremities, speech difficulties, vision problems and vomiting in the early morning.Other associated symptoms include: photophobia.  Symptoms which are not present include: abdominal pain, appetite decrease, diarrhea, fever, hoarseness, irritability, nasal congestion and neck stiffness. Home treatment has included acetaminophen and darkening the room with poor improvement.    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 She maintains that her last outbreak was greater than 1 month ago. She is currently not on suppressive therapy. She does not use barrier protection with sexual intercourse. She has 1 partner and has had a tubal ligation.    Past Medical History  Diagnosis Date  . Hypertension   . Herpes genitalia    Social History   Social History  . Marital Status: Single    Spouse  Name: N/A  . Number of Children: N/A  . Years of Education: N/A   Occupational History  . Not on file.   Social History Main Topics  . Smoking status: Never Smoker   . Smokeless tobacco: Not on file  . Alcohol Use: No  . Drug Use: No  . Sexual Activity: Yes    Birth Control/ Protection: Condom   Other Topics Concern  . Not on file   Social History Narrative   Review of Systems  Constitutional: Negative for fatigue.  HENT: Negative for congestion.   Eyes: Negative.   Respiratory: Negative.  Negative for wheezing.   Cardiovascular: Negative.  Negative for chest pain, palpitations and leg swelling.  Gastrointestinal: Negative.   Endocrine: Negative.  Negative for polydipsia, polyphagia and polyuria.  Genitourinary: Negative.   Musculoskeletal: Negative for neck pain and neck stiffness.  Skin: Negative.   Allergic/Immunologic: Negative for immunocompromised state.  Neurological: Positive for headaches. Negative for dizziness.  Hematological: Negative.   Psychiatric/Behavioral: Negative.  Negative for suicidal ideas and sleep disturbance.       Objective:   Physical Exam  Constitutional: She is oriented to person, place, and time. She appears well-developed and well-nourished.  HENT:  Head: Normocephalic and atraumatic.  Right Ear: External ear normal.  Left Ear: External ear normal.  Mouth/Throat: Oropharynx is clear and moist.  Eyes: Conjunctivae and EOM are normal. Pupils are equal, round, and reactive to light.  Neck: Normal range of motion. Neck supple.  Cardiovascular: Normal rate, regular rhythm, normal heart sounds and intact distal pulses.   Pulmonary/Chest:  Effort normal and breath sounds normal.  Abdominal: Soft. Bowel sounds are normal.  Musculoskeletal: Normal range of motion.  Neurological: She is alert and oriented to person, place, and time. She has normal reflexes.  Skin: Skin is warm and dry.  Psychiatric: She has a normal mood and affect. Her  behavior is normal. Thought content normal.         BP 102/64 mmHg  Pulse 68  Temp(Src) 97.7 F (36.5 C) (Oral)  Ht  (1.702 m)  Wt 212 lb (96.163 kg)  BMI 33.20 kg/m2  LMP 12/29/2014 Assessment & Plan:   1. Tension-type headache, not intractable, unspecified chronicity pattern Will start a trial of Fioricet for daily headaches. Patient will maintain a headache diary over the next months. Discussed foods that can potentially trigger migraine headaches, patient expressed understanding.  - butalbital-acetaminophen-caffeine (FIORICET) 50-325-40 MG tablet; Take 1 tablet by mouth every 6 (six) hours as needed for headache.  Dispense: 60 tablet; Refill: 0  2. Genital herpes Patient c/o recurrent genital herpes. Will start suppressive therapy. Recommend that patient utilize barrier protection with sexual intercourse. Recommend a diet high in Lysine and low in arguinine, discussed food.  - valACYclovir (VALTREX) 1000 MG tablet; Take 1 tablet (1,000 mg total) by mouth daily.  Dispense: 30 tablet; Refill: 5  3. Seasonal allergies - cetirizine (ZYRTEC) 5 MG tablet; Take 1 tablet (5 mg total) by mouth daily.  Dispense: 30 tablet; Refill: 2  4. Obesity Recommend a lowfat, low carbohydrate diet divided over 5-6 small meals, increase water intake to 6-8 glasses, and 150 minutes per week of cardiovascular exercise.   - CBC with Differential - COMPLETE METABOLIC PANEL WITH GFR - Hemoglobin A1c - Lipid Panel - TSH - POCT urinalysis dipstick  5. History of hypertension Blood pressure is currently at goal without medication. Will continue to monitor.   6. Generalized anxiety disorder Patient is under the care of [facility] Denies suicidal or homicidal intent. She is currently not on medications. She is working with therapist to identify tools to assist with anxiety.   7. Need for Tdap vaccination - Tdap vaccine greater than or equal to 7yo IM  8. Need for prophylactic  vaccination and inoculation against influenza - Flu Vaccine QUAD 36+ mos PF IM (Fluarix & Fluzone Quad PF)  9. Urine leukocytes Reviewed urinalysis, urine leukocytes present.   - Urine culture   Routine Health Maintenance:  Pap smear 1 year ago, negative per patient. Will review medical records as they become available Recommend monthly self-breast exams   RTC: 1 month for headaches   The patient was given clear instructions to go to ER or return to medical center if symptoms do not improve, worsen or new problems develop. The patient verbalized understanding. Will notify patient with laboratory results. Massie Maroon, FNP

## 2015-01-05 NOTE — Patient Instructions (Signed)
Keep a headache Diary Start Fiorcet every 6 hours as needed for headache pain

## 2015-01-06 ENCOUNTER — Telehealth: Payer: Self-pay

## 2015-01-06 LAB — URINE CULTURE: Colony Count: 30000

## 2015-01-06 NOTE — Telephone Encounter (Signed)
-----   Message from Massie MaroonLachina M Hollis, [place] sent at 01/06/2015  7:56 AM EST ----- Please inform Gina [name] that cholesterol is elevated at 250, goal is <200. Recommend a lowfat, low carbohydrate diet divided over 5-6 small meals, increase water intake to 6-8 glasses, and 150 minutes per week of cardiovascular exercise. Will check a fasting cholesterol panel in 3 months, will not start medications at this time.    Thanks  ----- Message -----    From: [facility]    Sent: 01/05/2015  11:06 PM      To: Massie MaroonLachina M Hollis, FNP

## 2015-01-06 NOTE — Telephone Encounter (Signed)
Called and left message advising patient of elevated cholesterol and that no medications where to be given at this time. Advised patient to start low fat/ low carb diet over 5 to 6 meals daily, drink 6 to 8 glasses of water and get 150 minutes of exercise weekly. Asked patient to call back if any questions. Thanks!

## 2015-01-14 ENCOUNTER — Other Ambulatory Visit: Payer: Medicaid Other

## 2015-01-19 ENCOUNTER — Ambulatory Visit: Payer: Medicaid Other | Admitting: Family Medicine

## 2015-01-26 ENCOUNTER — Encounter (HOSPITAL_COMMUNITY): Payer: Self-pay

## 2015-01-26 ENCOUNTER — Emergency Department (HOSPITAL_COMMUNITY)
Admission: EM | Admit: 2015-01-26 | Discharge: 2015-01-26 | Disposition: A | Discharge status: Home / Self Care | Payer: Medicaid Other | Attending: Emergency Medicine | Admitting: Emergency Medicine

## 2015-01-26 DIAGNOSIS — G43809 Other migraine, not intractable, without status migrainosus: Secondary | ICD-10-CM | POA: Diagnosis not present

## 2015-01-26 DIAGNOSIS — A599 Trichomoniasis, unspecified: Secondary | ICD-10-CM | POA: Diagnosis not present

## 2015-01-26 DIAGNOSIS — Z9851 Tubal ligation status: Secondary | ICD-10-CM | POA: Diagnosis not present

## 2015-01-26 DIAGNOSIS — Z79899 Other long term (current) drug therapy: Secondary | ICD-10-CM | POA: Diagnosis not present

## 2015-01-26 DIAGNOSIS — R112 Nausea with vomiting, unspecified: Secondary | ICD-10-CM | POA: Diagnosis present

## 2015-01-26 DIAGNOSIS — I10 Essential (primary) hypertension: Secondary | ICD-10-CM | POA: Insufficient documentation

## 2015-01-26 HISTORY — DX: Migraine, unspecified, not intractable, without status migrainosus: G43.909

## 2015-01-26 LAB — URINALYSIS, ROUTINE W REFLEX MICROSCOPIC
Bilirubin Urine: NEGATIVE
GLUCOSE, UA: NEGATIVE mg/dL
KETONES UR: NEGATIVE mg/dL
NITRITE: NEGATIVE
PROTEIN: 30 mg/dL — AB
Specific Gravity, Urine: 1.024 (ref 1.005–1.030)
pH: 7.5 (ref 5.0–8.0)

## 2015-01-26 LAB — CBC WITH DIFFERENTIAL/PLATELET
BASOS ABS: 0 10*3/uL (ref 0.0–0.1)
Basophils Relative: 0 %
Eosinophils Absolute: 0 10*3/uL (ref 0.0–0.7)
Eosinophils Relative: 1 %
HCT: 34.6 % — ABNORMAL LOW (ref 36.0–46.0)
Hemoglobin: 11.4 g/dL — ABNORMAL LOW (ref 12.0–15.0)
LYMPHS ABS: 1.4 10*3/uL (ref 0.7–4.0)
LYMPHS PCT: 35 %
MCH: 28.9 pg (ref 26.0–34.0)
MCHC: 32.9 g/dL (ref 30.0–36.0)
MCV: 87.6 fL (ref 78.0–100.0)
MONOS PCT: 9 %
Monocytes Absolute: 0.4 10*3/uL (ref 0.1–1.0)
NEUTROS ABS: 2.3 10*3/uL (ref 1.7–7.7)
NEUTROS PCT: 55 %
PLATELETS: 279 10*3/uL (ref 150–400)
RBC: 3.95 MIL/uL (ref 3.87–5.11)
RDW: 14 % (ref 11.5–15.5)
WBC: 4.1 10*3/uL (ref 4.0–10.5)

## 2015-01-26 LAB — COMPREHENSIVE METABOLIC PANEL
ALT: 18 U/L (ref 14–54)
ANION GAP: 8 (ref 5–15)
AST: 22 U/L (ref 15–41)
Albumin: 4.1 g/dL (ref 3.5–5.0)
Alkaline Phosphatase: 34 U/L — ABNORMAL LOW (ref 38–126)
BUN: 12 mg/dL (ref 6–20)
CHLORIDE: 104 mmol/L (ref 101–111)
CO2: 26 mmol/L (ref 22–32)
Calcium: 9 mg/dL (ref 8.9–10.3)
Creatinine, Ser: 0.86 mg/dL (ref 0.44–1.00)
GFR calc non Af Amer: >60 mL/min (ref >60)
Glucose, Bld: 123 mg/dL — ABNORMAL HIGH (ref 65–99)
POTASSIUM: 3.9 mmol/L (ref 3.5–5.1)
SODIUM: 138 mmol/L (ref 135–145)
Total Bilirubin: 0.3 mg/dL (ref 0.3–1.2)
Total Protein: 7.6 g/dL (ref 6.5–8.1)

## 2015-01-26 LAB — URINE MICROSCOPIC-ADD ON

## 2015-01-26 MED ORDER — PROCHLORPERAZINE EDISYLATE 5 MG/ML IJ SOLN
10.0000 mg | Freq: Four times a day (QID) | INTRAMUSCULAR | Status: DC | PRN
  Administered 2015-01-26: 10 mg via INTRAVENOUS
  Filled 2015-01-26: qty 2

## 2015-01-26 MED ORDER — SODIUM CHLORIDE 0.9 % IV BOLUS (SEPSIS)
1000.0000 mL | Freq: Once | INTRAVENOUS | Status: AC
  Administered 2015-01-26: 1000 mL via INTRAVENOUS

## 2015-01-26 MED ORDER — KETOROLAC TROMETHAMINE 30 MG/ML IJ SOLN
30.0000 mg | Freq: Once | INTRAMUSCULAR | Status: AC
  Administered 2015-01-26: 30 mg via INTRAVENOUS
  Filled 2015-01-26: qty 1

## 2015-01-26 MED ORDER — METOCLOPRAMIDE HCL 5 MG/ML IJ SOLN
10.0000 mg | Freq: Once | INTRAMUSCULAR | Status: AC
  Administered 2015-01-26: 10 mg via INTRAVENOUS
  Filled 2015-01-26: qty 2

## 2015-01-26 MED ORDER — SODIUM CHLORIDE 0.9 % IV BOLUS (SEPSIS)
500.0000 mL | Freq: Once | INTRAVENOUS | Status: AC
Start: 2015-01-26 — End: 2015-01-26
  Administered 2015-01-26: 500 mL via INTRAVENOUS

## 2015-01-26 MED ORDER — DIPHENHYDRAMINE HCL 50 MG/ML IJ SOLN
25.0000 mg | Freq: Once | INTRAMUSCULAR | Status: AC
  Administered 2015-01-26: 25 mg via INTRAVENOUS
  Filled 2015-01-26: qty 1

## 2015-01-26 NOTE — ED Notes (Signed)
Bed: WA01 Expected date:  Expected time:  Means of arrival:  Comments: [id number]

## 2015-01-26 NOTE — ED Notes (Addendum)
Attempted to walk patient to the bathroom.  She briefly became dizzy and near syncopal and unable to walk.  Placed back to bed and back on the monitor.  EDP made aware

## 2015-01-26 NOTE — ED Notes (Signed)
Pt up to bathroom.  Standby assistance with no dizziness this time.  States her LT flank area has hurt since she's gotten her meds here.

## 2015-01-26 NOTE — Discharge Instructions (Signed)
1. Medications: continue usual home medications including Flagyl  2. Treatment: rest, drink plenty of fluids 3. Follow Up: Please follow up with your primary doctor in 2-4 days for discussion of your diagnoses and further evaluation after today's visit; if you do not have a primary care doctor use the resource guide provided to find one; Please return to the ER for any new or worsening symptoms, any additional concerns.   Migraine Headache  A migraine headache is an intense, throbbing pain on one or both sides of your head. The exact cause of a migraine headache is not always known. A migraine may be caused when nerves in the brain become irritated and release chemicals that cause swelling within blood vessels, causing pain. Many migraine sufferers have a family history of migraines. Before you get a migraine you may or may not get an aura. An aura is a group of symptoms that can predict the beginning of a migraine. An aura may include:  Visual changes such as:  Flashing lights. And in and here in the Qual a Bright spots or zig-zag lines.  Tunnel vision.  Feelings of numbness.  Trouble talking.  Muscle weakness.   SYMPTOMS  Pain on one or both sides of your head.  Pain that is pulsating or throbbing in nature.  Pain that is severe enough to prevent daily activities.  Pain that is aggravated by any daily physical activity.  Nausea (feeling sick to your stomach), vomiting, or both.  Pain with exposure to bright lights, loud noises, or activity.  General sensitivity to bright lights or loud noises.   MIGRAINE TRIGGERS  Examples of triggers of migraine headaches include:  Alcohol.  Smoking.  Stress.  It may be related to menses (female menstruation).  Aged cheeses.  Foods or drinks that contain nitrates, glutamate, aspartame, or tyramine.  Lack of sleep.  Chocolate.  Caffeine.  Hunger.  Medications such as nitroglycerine (used to treat chest pain), birth control pills, estrogen, and  some blood pressure medications.    TREATMENT  Medications can help prevent migraines if they are recurrent or should they become recurrent. Your caregiver can help you with a medication or treatment program that will be helpful to you.  Lying down in a dark, quiet room may be helpful.  Keeping a headache diary may help you find a trend as to what may be triggering your headaches.   SEEK IMMEDIATE MEDICAL CARE IF:  You have confusion, personality changes or seizures.  You have headaches that wake you from sleep.  You have an increased frequency in your headaches.  You have a stiff neck.  You have a loss of vision.  You have muscle weakness.  You start losing your balance or have trouble walking.  You feel faint or pass out.   MAKE SURE YOU:  Understand these instructions.  Will watch your condition.  Will get help right away if you are not doing well or get worse.    Emergency Department Resource Guide 1) Find a Doctor and Pay Out of Pocket Although you won't have to find out who is covered by your insurance plan, it is a good idea to ask around and get recommendations. You will then need to call the office and see if the doctor you have chosen will accept you as a new patient and what types of options they offer for patients who are self-pay. Some doctors offer discounts or will set up payment plans for their patients who do not have  insurance, but you will need to ask so you aren't surprised when you get to your appointment.  2) Contact Your Local Health Department Not all health departments have doctors that can see patients for sick visits, but many do, so it is worth a call to see if yours does. If you don't know where your local health department is, you can check in your phone book. The CDC also has a tool to help you locate your state's health department, and many state websites also have listings of all of their local health departments.  3) Find a Walk-in Clinic If your  illness is not likely to be very severe or complicated, you may want to try a walk in clinic. These are popping up all over the country in pharmacies, drugstores, and shopping centers. They're usually staffed by nurse practitioners or physician assistants that have been trained to treat common illnesses and complaints. They're usually fairly quick and inexpensive. However, if you have serious medical issues or chronic medical problems, these are probably not your best option.  No Primary Care Doctor: - Call Health Connect at  [phone] - they can help you locate a primary care doctor that  accepts your insurance, provides certain services, etc. - Physician Referral Service- [phone]  Chronic Pain Problems: Organization         Address  Phone   Notes  [facility]  [phone] Patients need to be referred by their primary care doctor.   Medication Assistance: Organization         Address  Phone   Notes  [facility] Medication [place] [address] [place] [place], [place] [address] [phone] --Must be a resident of [facility] -- Must have NO insurance coverage whatsoever (no Medicaid/ Medicare, etc.) -- The pt. MUST have a primary care doctor that directs their care regularly and follows them in the community   [facility]  [phone]   [employer]  [phone]    Agencies that provide inexpensive medical care: Organization         Address  Phone   Notes  [facility]  [phone]   [facility]    [phone]   [facility] [address] [place], [place] [address] [phone]   [facility] [address], [place] [phone]   Planned Parenthood    [phone]   [facility]    [phone]   [facility]  [address], [place] Phone:  [phone], Fax:  [phone] Hours of Operation:   9 am - 6 pm, M-F.  Also accepts Medicaid/Medicare and self-pay.  [facility]  [address], [place] Phone: [phone], Fax: [phone]. Hours of Operation:  8:30 am - 5:30 pm, M-F.  Also accepts Medicaid and self-pay.  [facility] [place] [address] [place] Phone: [phone]   [facility] [address] Natasha Bence [place], [place] [phone], Ext. [phone] Mondays & Thursdays: 7-9 AM.  First 15 patients are seen on a first come, first serve basis.    Medicaid-accepting [facility] Providers:  Organization         Address  Phone   Notes  [facility] [address], [place] [phone] Also accepts self-pay patients.  [facility] [address]  Laurell Josephs [place], [place]  [phone]   [facility] [address], [place] [phone]   [facility] [address] [place] [phone]   Renaye Rakers [address], [place]   [phone] Only accepts [place] Access [place] patients after they have their name applied to their card.   Self-Pay (no insurance) in [facility]:  Organization         Address  Phone   Notes  Sickle Cell Patients, [facility] [address] [place], [place] [phone]   [facility] [address] [place], [place] [phone]   [facility] [place]  [address], [place] [phone]   [facility]/Dr. Osei-Bonsu  [address] [place] or [address], [place] [phone] Phone number for both [place] and [place] locations is the same.  [facility] and [facility] [address], [place] [phone]   [facility] [address] [place] or [address] [phone]   [facility] [address], [place] [phone], phone; [phone], fax Sees patients 1st and 3rd Saturday of every month.  Must not qualify for public or private insurance (i.e. Medicaid, Medicare, [place] Health Choice, Veterans' Benefits)  Household income should be no more than 200% of the poverty level The clinic cannot treat you if you are pregnant or think you are pregnant  Sexually transmitted diseases are not treated at the clinic.    Dental Care: Organization         Address  Phone  Notes  [facility] Department of [facility] [address] [place], [place] [phone] Accepts children up to age 29 who are enrolled in [place] or [place] Health Choice; pregnant women with a Medicaid card; and children who have applied for Medicaid or [place] Health Choice, but were declined, whose parents can pay a reduced fee at time of service.  [facility] Department of [facility]  [address], [place] [phone] Accepts children up to age 53 who are enrolled in [place] or [place] Health Choice; pregnant women with a Medicaid card; and children who have applied for Medicaid or [place] Health Choice, but were declined, whose parents can pay a reduced fee at time of service.  [facility]  [address] [place], [place] [phone] Patients are seen by appointment only. Walk-ins are not accepted. [facility] will see patients 34 years of age and older. Monday - Tuesday (8am-5pm) Most Wednesdays (8:30-5pm) $30 per visit, cash only  [facility] Adult [facility] Access [facility]  [address], [facility] [phone] Patients are seen by appointment only. Walk-ins are not accepted. [facility] will see patients 77 years of age and older. One Wednesday Evening (Monthly: Volunteer Based).  $30 per visit, cash only  [employer]  [phone] for adults; Children under age 67, call Graduate Pediatric Dentistry at  [phone]. Children aged 39-14, please call [phone] to request a pediatric application.  Dental services are provided in all areas of dental care including fillings, crowns and bridges, complete and partial dentures, implants, gum treatment, root canals, and extractions. Preventive care is also provided. Treatment is provided to both adults and children. Patients are selected via  a lottery and there is often a waiting list.   [facility] [address], [place]  [phone] [email or web address]   [facility] [address] [place], [place] [phone], Ext. [phone] Second and Fourth Thursday of each month, opens at 6:30 AM; Clinic ends at 9 AM.  Patients are seen on a first-come first-served basis, and a limited number are seen during each clinic.   [facility]  [address] [place], [place] [phone]   Eligibility Requirements You must have lived in [place], [place], or [place] counties for at least the last three months.   You cannot be eligible for state or federal sponsored [place], including [employer], [place], or [employer].   You generally cannot be eligible for healthcare insurance through your employer.    How to apply: Eligibility screenings are held every Tuesday and Wednesday afternoon from 1:00 pm until 4:00 pm. You do not need an appointment for the interview!  [facility] [address] [place], [place] [phone]   [facility] Health Department  [phone]   [facility] Health Department  [phone]   [facility] Health Department  [phone]    Behavioral Health Resources in the Community: Intensive Outpatient Programs Organization         Address  Phone  Notes  [facility] [address], [place], [place] [phone]   [facility]patient [address] [place], [place] [phone]   [facility]: Alcohol &  Drug Svcs [address], [place], [place]  [phone]   [facility] [address]  [place], [place] [phone] or [phone]   Substance Abuse Resources Organization         Address  Phone  Notes  [facility] Drug Services  [phone]   [facility]  [phone]   [facility] [place]  [phone]   Floydene Flock  [phone]   Residential & Outpatient Substance Abuse Program  [phone]   Psychological Services Organization         Address  Phone  Notes  [facility] Behavioral Health  [phone]   [facility] Services  [phone]   [facility] [address], [place] [phone] or [phone]    Mobile Crisis Teams Organization         Address  Phone  Notes  [facility], Mobile Crisis Care Unit  [phone]   [facility]  [address] [place], [place] [phone]   Doristine Locks [address] [place] [phone]    Self-Help/Support Groups Organization         Address  Phone             Notes  [facility] [place] - variety of support groups  [phone]- [phone] Call for more information  Narcotics Anonymous (NA), [facility] [address], [employer] [place]  2 meetings at this location   Statistician         Address  Phone  Notes  [facility] [address],    [place]  [phone]   [facility]  [address] [place] [address], [place], [place] [phone]   [facility] [address] [place], [place] [phone] Admissions: 8am-3pm M-F  [facility] [address]    [place], [place] [phone]   [facility] [address],  [place], [place] [phone]   [facility] [address]  [place], [place] [phone]   [facility] - Intensive Outpatient [address]ure[address], [place], [place]  [phone]   [facility] ([facility] [address]  [place], [place] [phone] or [phone]   [facility] ([facility]) [address] [place], [place] [phone] Accepts Medicaid  [facility] [address]  [place] [phone] Substance Abuse/Addiction Treatment   [facility]al Health Resources Organization         Address  Phone  Notes  [facility]  [phone]) [phone]   Angie FavaJulie Brannon, PhD [address] [place], [place]   [phone]) [phone] or [phone]) [phone]   [facility]   [address] [place], [place] [phone]   [facility] [address], [place], [place] [phone]) [phone] Insurance/Medicaid/sponsorship through [facility]  [facility] [address]                                    [place], [place] [phone]) [phone] Therapy/tele-psych/case  [facility] [address].   [place], [place] [phone](336) [phone]    Dr. Lolly MustacheArfeen  [phone]   [facility]  [employer] [facility] 1) [address], [place] 2) [address], [place] 3)  [address], [place] [phone]) [phone]) [phone]  [phone]) [phone]   [facility] Child Abuse Hotline [phone](336) [phone] or [phone](336) [phone] (After Hours)

## 2015-01-26 NOTE — ED Notes (Addendum)
Pt is from home.  Per report: has had generalized abdominal pain and n/v since yesterday.  Vomited once today.  NSR 86 on 12-lead.  VS: 140/100, 86, 18, 98% CBG 142.  SL #18 LAC.   Pt report: States she vomited approx 20 times throughout the night.  Also c/o diarrhea x 4 since yesterday.  Denies abdominal pain at present.  C/o migraine pain starting last night, 10/10 states her migraine meds aren't working.

## 2015-01-26 NOTE — ED Provider Notes (Addendum)
CSN: [id number]     Arrival date & time 01/26/15  7846 History   First MD Initiated Contact with Patient 01/26/15 0802     Chief Complaint  Patient presents with  . Abdominal Pain  . Emesis     (Consider location/radiation/quality/duration/timing/severity/associated sxs/prior Treatment) Patient is a 34 y.o. female presenting with abdominal pain and vomiting. The history is provided by the patient and medical records. No language interpreter was used.  Abdominal Pain Associated symptoms: nausea and vomiting   Associated symptoms: no constipation, no cough, no diarrhea, no shortness of breath and no sore throat   Emesis Associated symptoms: abdominal pain and headaches   Associated symptoms: no arthralgias, no diarrhea, no myalgias and no sore throat   Lillee L Underberg is a 34 y.o. female  with a PMH of HTN, migraines who presents to the Emergency Department complaining of constant, persistent frontal headache since last night associated with nausea and vomiting. Attempted to take home migraine medication but could not keep medication down, therefore it provided no relief. Pt. Also admits to blurry vision L>R. Pain is described as 10/10 throbbing and frontal in location.    Past Medical History  Diagnosis Date  . Hypertension   . Herpes genitalia   . Migraines    Past Surgical History  Procedure Laterality Date  . Tubal ligation     Family History  Problem Relation Age of Onset  . Diabetes Mother    Social History  Substance Use Topics  . Smoking status: Never Smoker   . Smokeless tobacco: None  . Alcohol Use: No   OB History    No data available     Review of Systems  Constitutional: Negative.   HENT: Negative for congestion, rhinorrhea and sore throat.   Eyes: Positive for visual disturbance.  Respiratory: Negative for cough, shortness of breath and wheezing.   Cardiovascular: Negative.   Gastrointestinal: Positive for nausea, vomiting and abdominal pain. Negative  for diarrhea and constipation.  Endocrine: Negative for polydipsia and polyuria.  Musculoskeletal: Negative for myalgias, back pain, arthralgias and neck pain.  Skin: Negative for rash.  Neurological: Positive for headaches. Negative for dizziness and weakness.      Allergies  Review of patient's allergies indicates no known allergies.  Home Medications   Prior to Admission medications   Medication Sig Start Date End Date Taking? Authorizing Provider  butalbital-acetaminophen-caffeine (FIORICET) 50-325-40 MG tablet Take 1 tablet by mouth every 6 (six) hours as needed for headache. 01/05/15 01/05/16 Yes Massie Maroon, FNP  cetirizine (ZYRTEC) 5 MG tablet Take 1 tablet (5 mg total) by mouth daily. 01/05/15  Yes Massie Maroon, FNP  valACYclovir (VALTREX) 1000 MG tablet Take 1 tablet (1,000 mg total) by mouth daily. 01/05/15  Yes Massie Maroon, FNP   BP 118/84 mmHg  Pulse 66  Temp(Src) 98.1 F (36.7 C) (Oral)  Resp 19  Ht  (1.702 m)  Wt 100.245 kg  BMI 34.61 kg/m2  SpO2 100%  LMP 01/26/2015 Physical Exam  Constitutional: She is oriented to person, place, and time. She appears well-developed and well-nourished. No distress.  HENT:  Head: Normocephalic and atraumatic.  Mouth/Throat: Oropharynx is clear and moist.  Eyes: Conjunctivae and EOM are normal. Pupils are equal, round, and reactive to light. No scleral icterus.  No nystagmus   Neck: Normal range of motion. Neck supple.  Full active and passive ROM without pain.  No midline or paraspinal tenderness. No nuchal rigidity or meningeal signs.  Cardiovascular: Normal rate, regular rhythm, normal heart sounds and intact distal pulses.   Pulmonary/Chest: Effort normal and breath sounds normal. No respiratory distress. She has no wheezes. She has no rales.  Abdominal: Soft. Bowel sounds are normal. She exhibits no distension. There is no rebound and no guarding.  Generalized abdominal tenderness  Musculoskeletal: Normal  range of motion.       Back:  No noted deformities, no swelling, no erythema TTP as depicted in image Full ROM; 5/5 muscle strength x 4 extremities  Lymphadenopathy:    She has no cervical adenopathy.  Neurological: She is alert and oriented to person, place, and time. No cranial nerve deficit. Coordination normal.  Mental Status: Alert, oriented, and thought content is appropriate. Speech is fluent without evidence of aphasia. Able to follow two-step commands without difficulty.  Cranial Nerves:  II - Peripheral visual fields grossly normal, pupils equal, round, reactive to light III, IV, VI - Bilateral EOM intact, no ptosis V - Facial light touch sensation intact and equal VII - Facial symmetry: smile, raised eyebrows ; Eyelids kept closed against resistance VIII - Hearing grossly normal bilaterally  IX, X - Uvula midline XI - Bilateral shoulder shrug equal and strong XII - Tongue extension midline Motor:  5/5 muscle strength of upper and lower extremities bilaterally including strong and equal grip strength and plantar/dorsiflexion.  Sensory:  Light touch sensory intact.   Skin: Skin is warm and dry. No rash noted. She is not diaphoretic.  Psychiatric: She has a normal mood and affect. Her behavior is normal. Judgment and thought content normal.  Nursing note and vitals reviewed.   ED Course  Procedures (including critical care time) Labs Review Labs Reviewed  COMPREHENSIVE METABOLIC PANEL - Abnormal; Notable for the following:    Glucose, Bld 123 (*)    Alkaline Phosphatase 34 (*)    All other components within normal limits  CBC WITH DIFFERENTIAL/PLATELET - Abnormal; Notable for the following:    Hemoglobin 11.4 (*)    HCT 34.6 (*)    All other components within normal limits  URINALYSIS, ROUTINE W REFLEX MICROSCOPIC (NOT AT [facility]) - Abnormal; Notable for the following:    APPearance TURBID (*)    Hgb urine dipstick LARGE (*)    Protein, ur 30 (*)    Leukocytes, UA  MODERATE (*)    All other components within normal limits  URINE MICROSCOPIC-ADD ON - Abnormal; Notable for the following:    Squamous Epithelial / LPF TOO NUMEROUS TO COUNT (*)    Bacteria, UA MANY (*)    All other components within normal limits    Imaging Review No results found. I have personally reviewed and evaluated these images and lab results as part of my medical decision-making.   EKG Interpretation None      MDM   Final diagnoses:  Other migraine without status migrainosus, not intractable   Jakaylah L Olthoff presents with headache assoc. With n/v.   Labs: CBC, CMP [employer]:Toradol, benadryl, reglan; 1L fluids  9:41 AM - Patient re-evaluated and feels much improved.   A&P: Migraine headache  - Follow up with PCP  - Return precautions and home care instructions given.   Patient seen by and discussed with Dr. Adriana Simas who agrees with treatment plan.   Addendum: 11:02 AM - Informed by nurse that patient's headache has returned. Patient re-evaluated and complaining of 7/10 headache and left-sided back pain. No red flag sxs of back pain; will check urine  UA shows +  leuks, many bacteria- Trich present. Discussed dx with patient - she states that she was diagnosed 1-2 weeks ago has rx at home for this and is taking medication as directed. No dysuria, urgency, or frequency. Will send for cx.   1:20 PM - Patient re-evaluated, Headache has resolved. Patient feels improved but low back pain still present.  I explained the diagnosis and have given precautions as to when/if to return to the ER including for any other new or worsening symptoms. The patient understands and accepts the medical plan as it's been dictated and I have answered their questions. Discharge instructions concerning home care and prescriptions have been given. The patient is stable and is discharged to home in good condition.  Blood pressure 118/84, pulse 66, temperature 98.1 F (36.7 C), temperature  source Oral, resp. rate 19, height 5\' 7"  (1.702 m), weight 100.245 kg, last menstrual period 01/26/2015, SpO2 100 %.  [facility] Pilcher Mekisha Bittel, PA-C 01/26/15 1015  Donnetta HutchingBrian Cook, MD 01/26/15 [address]1028  Janet Humphreys Pilcher Jabez Molner, PA-C 01/26/15 1420  Donnetta HutchingBrian Cook, MD 01/28/15 [phone]

## 2015-01-26 NOTE — ED Notes (Signed)
Pt reports return of headache.  EDP made aware.

## 2015-01-27 LAB — URINE CULTURE

## 2015-01-28 ENCOUNTER — Telehealth (HOSPITAL_COMMUNITY): Payer: Self-pay

## 2015-01-28 NOTE — Telephone Encounter (Signed)
Post ED Visit - Positive Culture Follow-up: Successful Patient Follow-Up  Culture assessed and recommendations reviewed by: []  Enzo BiNathan Batchelder, Pharm.D. []  Celedonio MiyamotoJeremy Frens, Pharm.D., BCPS [x]  Garvin FilaMike Maccia, Pharm.D. []  Georgina PillionElizabeth Martin, Pharm.D., BCPS []  Hal[place] CoveMinh Pham, [address]Pharm.D., BCPS, AAHIVP []  Estella HuskMichelle Turner, Pharm.D., BCPS, AAHIVP []  Tennis Mustassie Stewart, Pharm.D. []  Sherle Poeob Vincent, [address]Pharm[address]  Positive urine culture, >/= 100,000 colonies -> Group B Strep And Trich. Noted on UA  [x]  Patient discharged without antimicrobial prescription and treatment is now indicated []  Organism is resistant to prescribed ED discharge antimicrobial []  Patient with positive blood cultures  Changes discussed with ED provider: Dinah Beers. Greene PA New antibiotic prescription "Flagyl 2 g x 1 Called to   Contacted patient, date 01/28/2015, time 9:43 LVM requesting callback.  Letter sent to [facility] address.  [name], [name] 01/28/2015, 9:38 AM

## 2015-01-28 NOTE — Progress Notes (Signed)
ED Antimicrobial Stewardship Positive Culture Follow Up   Gina GottronCeleste L [name] is an 34 y.o. female [facility] with a chief complaint of  Chief Complaint  Patient presents with  . Abdominal Pain  . Emesis    Recent Results (from the past 720 hour(s))  Urine culture     Status: None   Collection Time: 01/05/15 12:24 PM  Result Value Ref Range Status   Colony Count 30,000 COLONIES/ML  Final   Organism ID, Bacteria Multiple bacterial morphotypes present, none  Final   Organism ID, Bacteria predominant. Suggest appropriate recollection if   Final   Organism ID, Bacteria clinically indicated.  Final  Urine culture     Status: None   Collection Time: 01/26/15 12:05 PM  Result Value Ref Range Status   Specimen Description URINE, CLEAN CATCH  Final   Special Requests NONE  Final   Culture   Final    >=100,000 COLONIES/mL GROUP B STREP(S.AGALACTIAE)ISOLATED TESTING AGAINST S. AGALACTIAE NOT ROUTINELY PERFORMED DUE TO PREDICTABILITY OF AMP/PEN/VAN SUSCEPTIBILITY. [facility]    Report Status 01/27/2015 FINAL  Final   [x]  Patient discharged originally without antimicrobial agent and treatment is now indicated No symptoms of UTI but trich present on UA  New antibiotic prescription: Metronidazole 2 gm x 1 dose  ED Provider: Marlon Peliffany Greene, PA-C  Gina [name] 01/28/2015, 8:33 AM Infectious Diseases Pharmacist Phone# [phone]

## 2015-02-06 ENCOUNTER — Encounter: Payer: Self-pay | Admitting: Family Medicine

## 2015-02-06 ENCOUNTER — Ambulatory Visit (INDEPENDENT_AMBULATORY_CARE_PROVIDER_SITE_OTHER): Payer: Medicaid Other | Admitting: Family Medicine

## 2015-02-06 VITALS — BP 136/92 | HR 66 | Temp 98.1°F | Resp 16 | Ht 67.0 in | Wt 208.0 lb

## 2015-02-06 DIAGNOSIS — R51 Headache: Secondary | ICD-10-CM | POA: Diagnosis not present

## 2015-02-06 DIAGNOSIS — F411 Generalized anxiety disorder: Secondary | ICD-10-CM | POA: Diagnosis not present

## 2015-02-06 DIAGNOSIS — R519 Headache, unspecified: Secondary | ICD-10-CM

## 2015-02-06 MED ORDER — BUTALBITAL-APAP-CAFFEINE 50-325-40 MG PO TABS: 1.0000 | ORAL_TABLET | Freq: Four times a day (QID) | ORAL | Status: DC | PRN

## 2015-02-06 MED ORDER — BUSPIRONE HCL 7.5 MG PO TABS: 7.5000 mg | ORAL_TABLET | Freq: Two times a day (BID) | ORAL | Status: DC

## 2015-02-06 NOTE — Progress Notes (Signed)
Subjective:    Patient ID: Gina [name], female    DOB: 10/13/1980, 35 y.o.   MRN: [id number]  Headache  Pertinent negatives include no dizziness or neck pain.   Ms. Gina DeedsCeleste [name], a 35 year old female presents for a follow up of tension headaches and anxiety. Patient continues to complain of daily headaches.  Symptoms began  several years ago and typically occur daily. Generally the headache lasts several hours and is primarily to the front of head bilaterally. She states that she has been taking Fioricet with moderate relief. The headache do not seem to be related to any time of the day. The headaches are usually dull and are bilateral in location.  The patient rates her most severe headaches a 5 on a scale from 1 to 10. Recently, the headaches are increasing in frequency.  No precipitating factors have been identified. Ms. Chestine [name] denies depression, dizziness, loss of balance, muscle weakness, numbness of extremities, speech difficulties, vision problems and vomiting in the early morning.Other associated symptoms include: photophobia.  Symptoms which are not present include: abdominal pain, appetite decrease, diarrhea, fever, hoarseness, irritability, nasal congestion and neck stiffness. Home treatment has included acetaminophen and darkening the room with poor improvement.  She is also complaining of anxiety. She states that headaches are mostly associated with increased stress. She maintains that her primary trigger for headaches and anxiety is stress. Symptoms of anxiety include difficulty concentrating, constantly worrying, irritability and easily annoyed.  She currently denies suicidal or homicidal intent. She is [facility]. She states that she was referred to a psychiatrist, her appointment is scheduled for March.    Past Medical History  Diagnosis Date  . Hypertension   . Herpes genitalia   . Migraines    Social History   Social History  .  Marital Status: Single    Spouse Name: N/A  . Number of Children: N/A  . Years of Education: N/A   Occupational History  . Not on file.   Social History Main Topics  . Smoking status: Never Smoker   . Smokeless tobacco: Not on file  . Alcohol Use: No  . Drug Use: No  . Sexual Activity: Yes    Birth Control/ Protection: Condom   Other Topics Concern  . Not on file   Social History Narrative   Review of Systems  Constitutional: Negative for fatigue.  HENT: Negative for congestion.   Eyes: Negative.   Respiratory: Negative.  Negative for wheezing.   Cardiovascular: Negative.  Negative for chest pain, palpitations and leg swelling.  Gastrointestinal: Negative.   Endocrine: Negative.  Negative for polydipsia, polyphagia and polyuria.  Genitourinary: Negative.   Musculoskeletal: Negative for neck pain and neck stiffness.  Skin: Negative.   Allergic/Immunologic: Negative for immunocompromised state.  Neurological: Positive for headaches. Negative for dizziness.  Hematological: Negative.   Psychiatric/Behavioral: Negative.  Negative for suicidal ideas and sleep disturbance.       Objective:   Physical Exam  Constitutional: She is oriented to person, place, and time. She appears well-developed and well-nourished.  HENT:  Head: Normocephalic and atraumatic.  Right Ear: External ear normal.  Left Ear: External ear normal.  Mouth/Throat: Oropharynx is clear and moist.  Eyes: Conjunctivae and EOM are normal. Pupils are equal, round, and reactive to light.  Neck: Normal range of motion. Neck supple.  Cardiovascular: Normal rate, regular rhythm, normal heart sounds and intact distal pulses.   Pulmonary/Chest: Effort normal and breath sounds  normal.  Abdominal: Soft. Bowel sounds are normal.  Musculoskeletal: Normal range of motion.  Neurological: She is alert and oriented to person, place, and time. She has normal reflexes.  Skin: Skin is warm and dry.  Psychiatric: She has a  normal mood and affect. Her behavior is normal. Thought content normal.      BP 136/92 mmHg  Pulse 66  Temp(Src) 98.1 F (36.7 C) (Oral)  Resp 16  Ht 5\' 7"  (1.702 m)  Wt 208 lb (94.348 kg)  BMI 32.57 kg/m2  LMP 01/26/2015 Assessment & Plan:   1. Nonintractable headache, unspecified chronicity pattern, unspecified headache type Will continue fioricet as previously prescribed. Patient asked to maintain a headache diary over the next month. Discussed at length and given a headache notebook.  - butalbital-acetaminophen-caffeine (FIORICET) 50-325-40 MG tablet; Take 1 tablet by mouth every 6 (six) hours as needed for headache.  Dispense: 60 tablet; Refill: 0 2. Generalized anxiety disorder Patient is under the care of [facility]. Denies suicidal or homicidal intent. She is currently not on medications. She is working with therapist to identify tools to assist with anxiety. Stress is primary trigger. Reviewed anxiety screening tool, 17, which suggest severe anxiety. Will start a trial of Buspar BID. Will follow up in 1 month.   - busPIRone (BUSPAR) 7.5 MG tablet; Take 1 tablet (7.5 mg total) by mouth 2 (two) times daily.  Dispense: 60 tablet; Refill: 2   RTC: 1 month for headache and anxiety  The patient was given clear instructions to go to ER or return to medical center if symptoms do not improve, worsen or new problems develop. The patient verbalized understanding. Will notify patient with laboratory results. Gina Maroon, FNP

## 2015-02-06 NOTE — Patient Instructions (Signed)
General Headache Without Cause °A headache is pain or discomfort felt around the head or neck area. The specific cause of a headache may not be found. There are many causes and types of headaches. A few common ones are: °· Tension headaches. °· Migraine headaches. °· Cluster headaches. °· Chronic daily headaches. °HOME CARE INSTRUCTIONS  °Watch your condition for any changes. Take these steps to help with your condition: °Managing Pain °· Take over-the-counter and prescription medicines only as told by your health care provider. °· Lie down in a dark, quiet room when you have a headache. °· If directed, apply ice to the head and neck area: °· Put ice in a plastic bag. °· Place a towel between your skin and the bag. °· Leave the ice on for 20 minutes, 2-3 times per day. °· Use a heating pad or hot shower to apply heat to the head and neck area as told by your health care provider. °· Keep lights dim if bright lights bother you or make your headaches worse. °Eating and Drinking °· Eat meals on a regular schedule. °· Limit alcohol use. °· Decrease the amount of caffeine you drink, or stop drinking caffeine. °General Instructions °· Keep all follow-up visits as told by your health care provider. This is important. °· Keep a headache journal to help find out what may trigger your headaches. For example, write down: °¨ What you eat and drink. °¨ How much sleep you get. °¨ Any change to your diet or medicines. °· Try massage or other relaxation techniques. °· Limit stress. °· Sit up straight, and do not tense your muscles. °· Do not use tobacco products, including cigarettes, chewing tobacco, or e-cigarettes. If you need help quitting, ask your health care provider. °· Exercise regularly as told by your health care provider. °· Sleep on a regular schedule. Get 7-9 hours of sleep, or the amount recommended by your health care provider. °SEEK MEDICAL CARE IF:  °· Your symptoms are not helped by medicine. °· You have a  headache that is different from the usual headache. °· You have nausea or you vomit. °· You have a fever. °SEEK IMMEDIATE MEDICAL CARE IF:  °· Your headache becomes severe. °· You have repeated vomiting. °· You have a stiff neck. °· You have a loss of vision. °· You have problems with speech. °· You have pain in the eye or ear. °· You have muscular weakness or loss of muscle control. °· You lose your balance or have trouble walking. °· You feel faint or pass out. °· You have confusion. °  °This information is not intended to replace advice given to you by your health care provider. Make sure you discuss any questions you have with your health care provider. °  °Document Released: 01/17/2005 Document Revised: 10/08/2014 Document Reviewed: 05/12/2014 °Elsevier Interactive Patient Education ©2016 [employer] °Generalized Anxiety Disorder °Generalized anxiety disorder (GAD) is a mental disorder. It interferes with life functions, including relationships, work, and school. °GAD is different from normal anxiety, which everyone experiences at some point in their lives in response to specific life events and activities. Normal anxiety actually helps us prepare for and get through these life events and activities. Normal anxiety goes away after the event or activity is over.  °GAD causes anxiety that is not necessarily related to specific events or activities. It also causes excess anxiety in proportion to specific events or activities. The anxiety associated with GAD is also difficult to control. GAD can   vary from mild to severe. People with severe GAD can have intense waves of anxiety with physical symptoms (panic attacks).  °SYMPTOMS °The anxiety and worry associated with GAD are difficult to control. This anxiety and worry are related to many life events and activities and also occur more days than not for 6 months or longer. People with GAD also have three or more of the following symptoms (one or more in  children): °· Restlessness.   °· Fatigue. °· Difficulty concentrating.   °· Irritability. °· Muscle tension. °· Difficulty sleeping or unsatisfying sleep. °DIAGNOSIS °GAD is diagnosed through an assessment by your health care provider. Your health care provider will ask you questions about your mood, physical symptoms, and events in your life. Your health care provider may ask you about your medical history and use of alcohol or drugs, including prescription medicines. Your health care provider may also do a physical exam and blood tests. Certain medical conditions and the use of certain substances can cause symptoms similar to those associated with GAD. Your health care provider may refer you to a mental health specialist for further evaluation. °TREATMENT °The following therapies are usually used to treat GAD:  °· Medication. Antidepressant medication usually is prescribed for long-term daily control. Antianxiety medicines may be added in severe cases, especially when panic attacks occur.   °· Talk therapy (psychotherapy). Certain types of talk therapy can be helpful in treating GAD by providing support, education, and guidance. A form of talk therapy called cognitive behavioral therapy can teach you healthy ways to think about and react to daily life events and activities. °· Stress management techniques. These include yoga, meditation, and exercise and can be very helpful when they are practiced regularly. °A mental health specialist can help determine which treatment is best for you. Some people see improvement with one therapy. However, other people require a combination of therapies. °  °This information is not intended to replace advice given to you by your health care provider. Make sure you discuss any questions you have with your health care provider. °  °Document Released: 05/14/2012 Document Revised: 02/07/2014 Document Reviewed: 05/14/2012 °Elsevier Interactive Patient Education ©2016 [employer]. ° °

## 2015-04-01 ENCOUNTER — Emergency Department (HOSPITAL_COMMUNITY): Payer: Medicaid Other

## 2015-04-01 ENCOUNTER — Emergency Department (HOSPITAL_COMMUNITY)
Admission: EM | Admit: 2015-04-01 | Discharge: 2015-04-01 | Disposition: A | Discharge status: Home / Self Care | Payer: Medicaid Other | Attending: Emergency Medicine | Admitting: Emergency Medicine

## 2015-04-01 ENCOUNTER — Encounter (HOSPITAL_COMMUNITY): Payer: Self-pay | Admitting: Family Medicine

## 2015-04-01 DIAGNOSIS — I10 Essential (primary) hypertension: Secondary | ICD-10-CM | POA: Insufficient documentation

## 2015-04-01 DIAGNOSIS — Z8619 Personal history of other infectious and parasitic diseases: Secondary | ICD-10-CM | POA: Diagnosis not present

## 2015-04-01 DIAGNOSIS — Z79899 Other long term (current) drug therapy: Secondary | ICD-10-CM | POA: Diagnosis not present

## 2015-04-01 DIAGNOSIS — G43909 Migraine, unspecified, not intractable, without status migrainosus: Secondary | ICD-10-CM | POA: Insufficient documentation

## 2015-04-01 DIAGNOSIS — M79651 Pain in right thigh: Secondary | ICD-10-CM | POA: Insufficient documentation

## 2015-04-01 DIAGNOSIS — M79604 Pain in right leg: Secondary | ICD-10-CM

## 2015-04-01 MED ORDER — METHOCARBAMOL 500 MG PO TABS
500.0000 mg | ORAL_TABLET | Freq: Once | ORAL | Status: AC
  Administered 2015-04-01: 500 mg via ORAL
  Filled 2015-04-01: qty 1

## 2015-04-01 MED ORDER — KETOROLAC TROMETHAMINE 60 MG/2ML IM SOLN
60.0000 mg | Freq: Once | INTRAMUSCULAR | Status: AC
  Administered 2015-04-01: 60 mg via INTRAMUSCULAR
  Filled 2015-04-01: qty 2

## 2015-04-01 MED ORDER — METHOCARBAMOL 500 MG PO TABS: 500.0000 mg | ORAL_TABLET | Freq: Two times a day (BID) | ORAL | Status: DC

## 2015-04-01 MED ORDER — NAPROXEN 500 MG PO TABS: 500.0000 mg | ORAL_TABLET | Freq: Two times a day (BID) | ORAL | Status: DC

## 2015-04-01 NOTE — Discharge Instructions (Signed)
Schedule a follow up appointment with your PCP if your pain does not improve. Return to ED with new, worsening or concerning symptoms.    Musculoskeletal Pain Musculoskeletal pain is muscle and boney aches and pains. These pains can occur in any part of the body. Your caregiver may treat you without knowing the cause of the pain. They may treat you if blood or urine tests, X-rays, and other tests were normal.  CAUSES There is often not a definite cause or reason for these pains. These pains may be caused by a type of germ (virus). The discomfort may also come from overuse. Overuse includes working out too hard when your body is not fit. Boney aches also come from weather changes. Bone is sensitive to atmospheric pressure changes. HOME CARE INSTRUCTIONS   Ask when your test results will be ready. Make sure you get your test results.  Only take over-the-counter or prescription medicines for pain, discomfort, or fever as directed by your caregiver. If you were given medications for your condition, do not drive, operate machinery or power tools, or sign legal documents for 24 hours. Do not drink alcohol. Do not take sleeping pills or other medications that may interfere with treatment.  Continue all activities unless the activities cause more pain. When the pain lessens, slowly resume normal activities. Gradually increase the intensity and duration of the activities or exercise.  During periods of severe pain, bed rest may be helpful. Lay or sit in any position that is comfortable.  Putting ice on the injured area.  Put ice in a bag.  Place a towel between your skin and the bag.  Leave the ice on for 15 to 20 minutes, 3 to 4 times a day.  Follow up with your caregiver for continued problems and no reason can be found for the pain. If the pain becomes worse or does not go away, it may be necessary to repeat tests or do additional testing. Your caregiver may need to look further for a possible  cause. SEEK IMMEDIATE MEDICAL CARE IF:  You have pain that is getting worse and is not relieved by medications.  You develop chest pain that is associated with shortness or breath, sweating, feeling sick to your stomach (nauseous), or throw up (vomit).  Your pain becomes localized to the abdomen.  You develop any new symptoms that seem different or that concern you. MAKE SURE YOU:   Understand these instructions.  Will watch your condition.  Will get help right away if you are not doing well or get worse.   This information is not intended to replace advice given to you by your health care provider. Make sure you discuss any questions you have with your health care provider.   Document Released: 01/17/2005 Document Revised: 04/11/2011 Document Reviewed: 09/21/2012 Elsevier Interactive Patient Education 2016 Elsevier Inc[employer]  [employer] Therapy Heat therapy can help ease sore, stiff, injured, and tight muscles and joints. Heat relaxes your muscles, which may help ease your pain.  RISKS AND COMPLICATIONS If you have any of the following conditions, do not use heat therapy unless your health care provider has approved:  Poor circulation.  Healing wounds or scarred skin in the area being treated.  Diabetes, heart disease, or high blood pressure.  Not being able to feel (numbness) the area being treated.  Unusual swelling of the area being treated.  Active infections.  Blood clots.  Cancer.  Inability to communicate pain. This may include young children and people who  have problems with their brain function (dementia).  Pregnancy. Heat therapy should only be used on old, pre-existing, or long-lasting (chronic) injuries. Do not use heat therapy on new injuries unless directed by your health care provider. HOW TO USE HEAT THERAPY There are several different kinds of heat therapy, including:  Moist heat pack.  Warm water bath.  Hot water bottle.  Electric heating  pad.  Heated gel pack.  Heated wrap.  Electric heating pad. Use the heat therapy method suggested by your health care provider. Follow your health care provider's instructions on when and how to use heat therapy. GENERAL HEAT THERAPY RECOMMENDATIONS  Do not sleep while using heat therapy. Only use heat therapy while you are awake.  Your skin may turn pink while using heat therapy. Do not use heat therapy if your skin turns red.  Do not use heat therapy if you have new pain.  High heat or long exposure to heat can cause burns. Be careful when using heat therapy to avoid burning your skin.  Do not use heat therapy on areas of your skin that are already irritated, such as with a rash or sunburn. SEEK MEDICAL CARE IF:  You have blisters, redness, swelling, or numbness.  You have new pain.  Your pain is worse. MAKE SURE YOU:  Understand these instructions.  Will watch your condition.  Will get help right away if you are not doing well or get worse.   This information is not intended to replace advice given to you by your health care provider. Make sure you discuss any questions you have with your health care provider.   Document Released: 04/11/2011 Document Revised: 02/07/2014 Document Reviewed: 03/12/2013 Elsevier Interactive Patient Education [employer]

## 2015-04-01 NOTE — ED Notes (Signed)
Patient able to ambulate independently  

## 2015-04-01 NOTE — ED Provider Notes (Signed)
CSN: [id number]     Arrival date & time 04/01/15  1751 History  By signing my name below, I, Gina [name], attest that this documentation has been prepared under the direction and in the presence of [name], PA-C. Electronically Signed: [name], ED Scribe. 04/01/2015. 6:19 PM.    Chief Complaint  Patient presents with  . Leg Pain   Patient is a 35 Gina.o. female presenting with leg pain. The history is provided by the patient. No language interpreter was used.  Leg Pain Location:  Leg Injury: no   Leg location:  R upper leg Pain details:    Quality:  Sharp   Radiates to:  Does not radiate   Severity:  Severe   Onset quality:  Sudden   Timing:  Constant   Progression:  Unchanged Prior injury to area:  No Relieved by:  Rest Worsened by:  Bearing weight and activity Ineffective treatments:  NSAIDs Associated symptoms: tingling   Associated symptoms: no back pain, no decreased ROM, no fever, no muscle weakness, no numbness and no swelling   Risk factors: obesity    HPI Comments: [name] is a 35 Gina.o. female who presents to the Emergency Department complaining of sudden onset sharp right thigh pain today. The pain is located over the top of the right thigh. She states it feels like a muscle strain. Pt states that she first noticed the pain when trying to get out of bed. She states trying to bear weight, ambulating or bending the knee makes the pain worse. When she rests the leg, the pain is nearly resolved. The pain does not radiate. She denies previous injury to the area. She reports some slight tingling in her right foot. Pt states she has tried ibuprofen with no relief. Pt denies recent strenuous activity. She states that she works at an active job and believes this may have caused the pain. Denies fevers, chills, back pain, weakness of the leg, limited ROM, numbness, loss of sensation or color change of the leg. She has no other complaints today. Denies hx of DVTs.     Past Medical History  Diagnosis Date  . Hypertension   . Herpes genitalia   . Migraines    Past Surgical History  Procedure Laterality Date  . Tubal ligation     Family History  Problem Relation Age of Onset  . Diabetes Mother    Social History  Substance Use Topics  . Smoking status: Never Smoker   . Smokeless tobacco: None  . Alcohol Use: No   OB History    No data available     Review of Systems  Constitutional: Negative for fever.  Musculoskeletal: Positive for myalgias. Negative for back pain.  Neurological: Negative for weakness and numbness.  All other systems reviewed and are negative.     Allergies  Review of patient's allergies indicates no known allergies.  Home Medications   Prior to Admission medications   Medication Sig Start Date End Date Taking? Authorizing Provider  busPIRone (BUSPAR) 7.5 MG tablet Take 1 tablet (7.5 mg total) by mouth 2 (two) times daily. 02/06/15   Massie Maroon, FNP  butalbital-acetaminophen-caffeine (FIORICET) [phone] MG tablet Take 1 tablet by mouth every 6 (six) hours as needed for headache. 02/06/15 02/06/16  Massie Maroon, FNP  cetirizine (ZYRTEC) 5 MG tablet Take 1 tablet (5 mg total) by mouth daily. 01/05/15   Massie Maroon, FNP  methocarbamol (ROBAXIN) 500 MG tablet Take 1 tablet (500  mg total) by mouth 2 (two) times daily. 04/01/15   Jenette Rayson, PA-C  naproxen (NAPROSYN) 500 MG tablet Take 1 tablet (500 mg total) by mouth 2 (two) times daily. 04/01/15   Liviya Santini, PA-C  valACYclovir (VALTREX) 1000 MG tablet Take 1 tablet (1,000 mg total) by mouth daily. 01/05/15   Massie Maroon, FNP   BP 130/65 mmHg  Pulse 57  Temp(Src) 98 F (36.7 C) (Oral)  Resp 18  SpO2 100%  LMP 02/01/2015   Physical Exam  Constitutional: She appears well-developed and well-nourished. No distress.  Nontoxic-appearing  HENT:  Head: Normocephalic and atraumatic.  Right Ear: External ear normal.  Left Ear: External ear normal.   Eyes: Conjunctivae are normal. Right eye exhibits no discharge. Left eye exhibits no discharge. No scleral icterus.  Neck: Normal range of motion.  Cardiovascular: Normal rate and intact distal pulses.   Pedal pulses palpable.  Pulmonary/Chest: Effort normal.  Musculoskeletal: Normal range of motion.       Right upper leg: She exhibits tenderness. She exhibits no swelling, no edema and no deformity.       Legs: Diffuse tenderness over the anterior right upper leg as demonstrated in diagram. No overlying skin changes. No induration, swelling, fluctuance, erythema, vesicles or ecchymosis. Full range of motion at the hip, knee and ankle intact though she reports pain on moving the hip and knee. No obvious deformity. Moves remaining extremities spontaneously and without pain.  Neurological: She is alert. Coordination normal.  5/5 strength of the bilateral knees and ankles. Refuses hip strength testing secondary to pain. Sensation to light touch intact throughout.  Skin: Skin is warm and dry.  Psychiatric: She has a normal mood and affect. Her behavior is normal.  Nursing note and vitals reviewed.   ED Course  Procedures (including critical care time) DIAGNOSTIC STUDIES: Oxygen Saturation is 100% on RA, normal by my interpretation.    COORDINATION OF CARE: 6:19 PM-Discussed treatment plan with pt at bedside and pt agreed to plan.     Labs Review Labs Reviewed - No data to display  Imaging Review Dg Femur, Min 2 Views Right  04/01/2015  CLINICAL DATA:  Right thigh pain.  No reported injury. EXAM: RIGHT FEMUR 2 VIEWS COMPARISON:  None. FINDINGS: No fracture, periosteal reaction or suspicious focal osseous lesion. No evidence of malalignment at the right hip or right knee. Small well corticated osseous fragment at the superior right acetabular margin likely represents an os acetabulum. IMPRESSION: Negative. Electronically Signed   By: Delbert Phenix M.D.   On: 04/01/2015 18:52      EKG  Interpretation None      MDM   Final diagnoses:  Right leg pain   Patient presenting with right anterior thigh pain x 1 day. Right leg is neurovascularly intact with FROM. Generalized tenderness over the anterior thigh. No induration, erythema, fluctuance or ecchymosis. Patient is able to ambulate though she states this is painful Patient X-Ray negative for acute findings. Pain managed in ED with toradol and robaxin. Pt is able to ambulate with a steady gait. Discussed RICE therapy and use of OTC pain relievers. Pt advised to follow up with PCP if symptoms persist. Return precautions discussed at bedside and given in discharge paperwork. Pt is stable for discharge.  I personally performed the services described in this documentation, which was scribed in my presence. The recorded information has been reviewed and is accurate.      Alveta Heimlich, PA-C 04/01/15 1959  Lebron Conners  Donnald Garre, MD 04/01/15 2049

## 2015-04-01 NOTE — ED Notes (Signed)
Pt here for right thigh pain that started last night, sharp pain 10/10. 120papBP,. 80 hr

## 2015-04-08 ENCOUNTER — Emergency Department (HOSPITAL_COMMUNITY)
Admission: EM | Admit: 2015-04-08 | Discharge: 2015-04-08 | Disposition: A | Discharge status: Home / Self Care | Payer: Medicaid Other | Source: Home / Self Care | Attending: Family Medicine | Admitting: Family Medicine

## 2015-04-08 ENCOUNTER — Ambulatory Visit: Payer: Medicaid Other | Admitting: Family Medicine

## 2015-04-08 ENCOUNTER — Encounter (HOSPITAL_COMMUNITY): Payer: Self-pay | Admitting: Emergency Medicine

## 2015-04-08 DIAGNOSIS — G44229 Chronic tension-type headache, not intractable: Secondary | ICD-10-CM

## 2015-04-08 MED ORDER — ONDANSETRON 4 MG PO TBDP
4.0000 mg | ORAL_TABLET | Freq: Once | ORAL | Status: AC
Start: 2015-04-08 — End: 2015-04-08
  Administered 2015-04-08: 4 mg via ORAL

## 2015-04-08 MED ORDER — KETOROLAC TROMETHAMINE 60 MG/2ML IM SOLN
60.0000 mg | Freq: Once | INTRAMUSCULAR | Status: AC
  Administered 2015-04-08: 60 mg via INTRAMUSCULAR

## 2015-04-08 MED ORDER — DIPHENHYDRAMINE HCL 50 MG/ML IJ SOLN
50.0000 mg | Freq: Once | INTRAMUSCULAR | Status: AC
  Administered 2015-04-08: 50 mg via INTRAMUSCULAR

## 2015-04-08 MED ORDER — KETOROLAC TROMETHAMINE 60 MG/2ML IM SOLN
INTRAMUSCULAR | Status: AC
  Filled 2015-04-08: qty 2

## 2015-04-08 MED ORDER — DEXAMETHASONE SODIUM PHOSPHATE 10 MG/ML IJ SOLN
INTRAMUSCULAR | Status: AC
  Filled 2015-04-08: qty 1

## 2015-04-08 MED ORDER — ONDANSETRON 4 MG PO TBDP
ORAL_TABLET | ORAL | Status: AC
  Filled 2015-04-08: qty 1

## 2015-04-08 MED ORDER — DEXAMETHASONE SODIUM PHOSPHATE 10 MG/ML IJ SOLN
10.0000 mg | Freq: Once | INTRAMUSCULAR | Status: AC
  Administered 2015-04-08: 10 mg via INTRAMUSCULAR

## 2015-04-08 MED ORDER — DIPHENHYDRAMINE HCL 50 MG/ML IJ SOLN
INTRAMUSCULAR | Status: AC
  Filled 2015-04-08: qty 1

## 2015-04-08 NOTE — ED Notes (Signed)
Patient reports a history of headaches for 10 years.  Patient has seen pcp for the same, reports having ct scans and being told nothing abnormal and headaches are stress related.  Patient denies any cold symptoms.  Patient reports this headache started yesterday.  Reports pain medicine provided by pcp ran out last month.

## 2015-04-08 NOTE — ED Provider Notes (Signed)
CSN: [id number]     Arrival date & time 04/08/15  1303 History   First MD Initiated Contact with Patient 04/08/15 1339     Chief Complaint  Patient presents with  . Headache   (Consider location/radiation/quality/duration/timing/severity/associated sxs/prior Treatment) HPI Comments: 35 year old female complaining of a headache. She states she has chronic daily headaches now for nearly 10 years. She has been evaluated by CT scans which have been normal. She has been told that they were stress related. Last month her PCP provider her with Fioricet No. 60 one twice a day. She is now out of those medications. She states that those helped but they can cause some drowsiness. She states this headache is similar to her usual headaches just a little more intense. She has some blurring of vision which is common for her and photophobia. Positive for mild nausea but no vomiting. Fully awake alert and oriented. Denies problems with diplopia, speech, hearing, swallowing, focal paresthesias or weakness. The headache is located primarily to the bitemporal and parietal areas. Patient states she had an appointment with her PCP this morning but was late in was unable to be seen. She was told that if she needed to she could go to the urgent care for treatment. She has rescheduled.   Past Medical History  Diagnosis Date  . Hypertension   . Herpes genitalia   . Migraines    Past Surgical History  Procedure Laterality Date  . Tubal ligation     Family History  Problem Relation Age of Onset  . Diabetes Mother    Social History  Substance Use Topics  . Smoking status: Never Smoker   . Smokeless tobacco: None  . Alcohol Use: No   OB History    No data available     Review of Systems  Constitutional: Positive for activity change. Negative for fever and fatigue.  Respiratory: Negative.  Negative for cough and shortness of breath.   Cardiovascular: Negative.   Gastrointestinal: Positive for nausea.  Negative for vomiting and abdominal pain.  Skin: Negative.   Neurological: Positive for headaches. Negative for tremors, seizures, syncope, facial asymmetry, speech difficulty and numbness.  All other systems reviewed and are negative.   Allergies  Review of patient's allergies indicates no known allergies.  Home Medications   Prior to Admission medications   Medication Sig Start Date End Date Taking? Authorizing Provider  busPIRone (BUSPAR) 7.5 MG tablet Take 1 tablet (7.5 mg total) by mouth 2 (two) times daily. 02/06/15   Massie Maroon, FNP  butalbital-acetaminophen-caffeine (FIORICET) [phone] MG tablet Take 1 tablet by mouth every 6 (six) hours as needed for headache. 02/06/15 02/06/16  Massie Maroon, FNP  cetirizine (ZYRTEC) 5 MG tablet Take 1 tablet (5 mg total) by mouth daily. 01/05/15   Massie Maroon, FNP  methocarbamol (ROBAXIN) 500 MG tablet Take 1 tablet (500 mg total) by mouth 2 (two) times daily. 04/01/15   Stevi Barrett, PA-C  naproxen (NAPROSYN) 500 MG tablet Take 1 tablet (500 mg total) by mouth 2 (two) times daily. 04/01/15   Stevi Barrett, PA-C  valACYclovir (VALTREX) 1000 MG tablet Take 1 tablet (1,000 mg total) by mouth daily. 01/05/15   Massie Maroon, FNP   Meds Ordered and Administered this Visit   Medications  ketorolac (TORADOL) injection 60 mg (not administered)  dexamethasone (DECADRON) injection 10 mg (not administered)  diphenhydrAMINE (BENADRYL) injection 50 mg (not administered)  ondansetron (ZOFRAN-ODT) disintegrating tablet 4 mg (not administered)    BP  124/88 mmHg  Pulse 67  Temp(Src) 97.8 F (36.6 C) (Oral)  Resp 16  SpO2 100%  LMP 02/01/2015 No data found.   Physical Exam  Constitutional: She is oriented to person, place, and time. She appears well-developed and well-nourished. No distress.  HENT:  Head: Normocephalic and atraumatic.  Mouth/Throat: No oropharyngeal exudate.   TMs are normal Oropharynx is clear and moist. Supple rises  symmetrically. Tongue and uvula midline. There is moderate tenderness to the bilateral parietal and bitemporal and frontal scalp musculature.  Eyes: Conjunctivae and EOM are normal. Pupils are equal, round, and reactive to light.  Neck: Normal range of motion. Neck supple.  Cardiovascular: Normal rate, regular rhythm and normal heart sounds.   Pulmonary/Chest: Effort normal. No respiratory distress. She has no wheezes.  Musculoskeletal: Normal range of motion. She exhibits no edema.  Lymphadenopathy:    She has no cervical adenopathy.  Neurological: She is alert and oriented to person, place, and time. She has normal strength. No cranial nerve deficit or sensory deficit. She exhibits normal muscle tone. Coordination normal. GCS eye subscore is 4. GCS verbal subscore is 5. GCS motor subscore is 6.  Skin: Skin is warm and dry.  Psychiatric: She has a normal mood and affect.  Nursing note and vitals reviewed.   ED Course  Procedures (including critical care time)  Labs Review Labs Reviewed - No data to display  Imaging Review No results found.   Visual Acuity Review  Right Eye Distance:   Left Eye Distance:   Bilateral Distance:    Right Eye Near:   Left Eye Near:    Bilateral Near:         MDM   1. Chronic tension-type headache, not intractable    Meds ordered this encounter  Medications  . ketorolac (TORADOL) injection 60 mg    Sig:   . dexamethasone (DECADRON) injection 10 mg    Sig:   . diphenhydrAMINE (BENADRYL) injection 50 mg    Sig:   . ondansetron (ZOFRAN-ODT) disintegrating tablet 4 mg    Sig:    Follow with your PCP    Hayden Rasmussenavid Marijke Guadiana, NP 04/08/15 1417

## 2015-04-08 NOTE — Discharge Instructions (Signed)
Tension Headache A tension headache is a feeling of pain, pressure, or aching that is often felt over the front and sides of the head. The pain can be dull, or it can feel tight (constricting). Tension headaches are not normally associated with nausea or vomiting, and they do not get worse with physical activity. Tension headaches can last from 30 minutes to several days. This is the most common type of headache. CAUSES The exact cause of this condition is not known. Tension headaches often begin after stress, anxiety, or depression. Other triggers may include:  Alcohol.  Too much caffeine, or caffeine withdrawal.  Respiratory infections, such as colds, flu, or sinus infections.  Dental problems or teeth clenching.  Fatigue.  Holding your head and neck in the same position for a long period of time, such as while using a computer.  Smoking. SYMPTOMS Symptoms of this condition include:  A feeling of pressure around the head.  Dull, aching head pain.  Pain felt over the front and sides of the head.  Tenderness in the muscles of the head, neck, and shoulders. DIAGNOSIS This condition may be diagnosed based on your symptoms and a physical exam. Tests may be done, such as a CT scan or an MRI of your head. These tests may be done if your symptoms are severe or unusual. TREATMENT This condition may be treated with lifestyle changes and medicines to help relieve symptoms. HOME CARE INSTRUCTIONS Managing Pain  Take over-the-counter and prescription medicines only as told by your health care provider.  Lie down in a dark, quiet room when you have a headache.  If directed, apply ice to the head and neck area:  Put ice in a plastic bag.  Place a towel between your skin and the bag.  Leave the ice on for 20 minutes, 2-3 times per day.  Use a heating pad or a hot shower to apply heat to the head and neck area as told by your health care provider. Eating and Drinking  Eat meals on  a regular schedule.  Limit alcohol use.  Decrease your caffeine intake, or stop using caffeine. General Instructions  Keep all follow-up visits as told by your health care provider. This is important.  Keep a headache journal to help find out what may trigger your headaches. For example, write down:  What you eat and drink.  How much sleep you get.  Any change to your diet or medicines.  Try massage or other relaxation techniques.  Limit stress.  Sit up straight, and avoid tensing your muscles.  Do not use tobacco products, including cigarettes, chewing tobacco, or e-cigarettes. If you need help quitting, ask your health care provider.  Exercise regularly as told by your health care provider.  Get 7-9 hours of sleep, or the amount recommended by your health care provider. SEEK MEDICAL CARE IF:  Your symptoms are not helped by medicine.  You have a headache that is different from what you normally experience.  You have nausea or you vomit.  You have a fever. SEEK IMMEDIATE MEDICAL CARE IF:  Your headache becomes severe.  You have repeated vomiting.  You have a stiff neck.  You have a loss of vision.  You have problems with speech.  You have pain in your eye or ear.  You have muscular weakness or loss of muscle control.  You lose your balance or you have trouble walking.  You feel faint or you pass out.  You have confusion.     This information is not intended to replace advice given to you by your health care provider. Make sure you discuss any questions you have with your health care provider.   Document Released: 01/17/2005 Document Revised: 10/08/2014 Document Reviewed: 05/12/2014 Elsevier Interactive Patient Education 2016 [employer]  

## 2015-04-15 ENCOUNTER — Encounter: Payer: Self-pay | Admitting: Family Medicine

## 2015-04-15 ENCOUNTER — Ambulatory Visit (INDEPENDENT_AMBULATORY_CARE_PROVIDER_SITE_OTHER): Payer: Medicaid Other | Admitting: Family Medicine

## 2015-04-15 VITALS — BP 123/90 | HR 76 | Temp 97.8°F | Resp 16 | Ht 67.0 in | Wt 209.0 lb

## 2015-04-15 DIAGNOSIS — R51 Headache: Secondary | ICD-10-CM

## 2015-04-15 DIAGNOSIS — F411 Generalized anxiety disorder: Secondary | ICD-10-CM | POA: Diagnosis not present

## 2015-04-15 DIAGNOSIS — G43709 Chronic migraine without aura, not intractable, without status migrainosus: Secondary | ICD-10-CM

## 2015-04-15 DIAGNOSIS — G43909 Migraine, unspecified, not intractable, without status migrainosus: Secondary | ICD-10-CM | POA: Insufficient documentation

## 2015-04-15 DIAGNOSIS — R519 Headache, unspecified: Secondary | ICD-10-CM

## 2015-04-15 MED ORDER — KETOROLAC TROMETHAMINE 60 MG/2ML IM SOLN
60.0000 mg | Freq: Once | INTRAMUSCULAR | Status: AC
  Administered 2015-04-15: 60 mg via INTRAMUSCULAR

## 2015-04-15 MED ORDER — BUSPIRONE HCL 10 MG PO TABS: 10.0000 mg | ORAL_TABLET | Freq: Two times a day (BID) | ORAL | Status: DC

## 2015-04-15 MED ORDER — BUTALBITAL-APAP-CAFFEINE 50-325-40 MG PO TABS: 1.0000 | ORAL_TABLET | Freq: Four times a day (QID) | ORAL | Status: DC | PRN

## 2015-04-15 MED ORDER — TOPIRAMATE 25 MG PO TABS: 25.0000 mg | ORAL_TABLET | Freq: Every day | ORAL | Status: DC

## 2015-04-15 NOTE — Progress Notes (Signed)
Subjective:    Patient ID: Zigmund Gottroneleste L Heitmeyer, female    DOB: 01/29/1981, 35 y.o.   MRN: [id number]  HPI Ms. Justice DeedsCeleste Neuser, a 35 year old female presents for a follow up of tension headaches and anxiety. Patient continues to complain of daily headaches.  Symptoms began  several years ago and typically occur daily. Generally the headache lasts several hours and is primarily to the front of head bilaterally. She states that she has been taking Fioricet with moderate relief. The headache do not seem to be related to any time of the day. The headaches are usually dull and are bilateral in location.   Current headache pain is 8/10. Recently, the headaches are increasing in frequency.  No precipitating factors have been identified. Ms. Chestine SporeClark denies depression, dizziness, loss of balance, muscle weakness, numbness of extremities, speech difficulties, vision problems and vomiting in the early morning.Other associated symptoms include: photophobia.  Symptoms which are not present include: abdominal pain, appetite decrease, diarrhea, fever, hoarseness, irritability, nasal congestion and neck stiffness. Home treatment has included acetaminophen and Fiorcet and darkening the room with poor improvement.  She is also complaining of anxiety. She states that headaches are mostly associated with increased stress. She maintains that her primary trigger for headaches and anxiety is stress. Symptoms of anxiety include difficulty concentrating, constantly worrying, irritability and easily annoyed.  She currently denies suicidal or homicidal intent. [facility] is [facility]. She states that she was referred to a psychiatrist, her appointment is scheduled for March.    Past Medical History  Diagnosis Date  . Hypertension   . Herpes genitalia   . Migraines    Social History   Social History  . Marital Status: Single    Spouse Name: N/A  . Number of Children: N/A  . Years of Education:  N/A   Occupational History  . Not on file.   Social History Main Topics  . Smoking status: Never Smoker   . Smokeless tobacco: Not on file  . Alcohol Use: No  . Drug Use: No  . Sexual Activity: Yes    Birth Control/ Protection: Condom   Other Topics Concern  . Not on file   Social History Narrative   Review of Systems  Constitutional: Negative for fatigue.  HENT: Negative for congestion.   Eyes: Negative.   Respiratory: Negative.  Negative for wheezing.   Cardiovascular: Negative.  Negative for chest pain, palpitations and leg swelling.  Gastrointestinal: Negative.   Endocrine: Negative.  Negative for polydipsia, polyphagia and polyuria.  Genitourinary: Negative.   Musculoskeletal: Negative for neck pain and neck stiffness.  Skin: Negative.   Allergic/Immunologic: Negative for immunocompromised state.  Neurological: Positive for headaches. Negative for dizziness.  Hematological: Negative.   Psychiatric/Behavioral: Negative.  Negative for suicidal ideas and sleep disturbance.       Objective:   Physical Exam  Constitutional: She is oriented to person, place, and time. She appears well-developed and well-nourished.  HENT:  Head: Normocephalic and atraumatic.  Right Ear: External ear normal.  Left Ear: External ear normal.  Mouth/Throat: Oropharynx is clear and moist.  Eyes: Conjunctivae and EOM are normal. Pupils are equal, round, and reactive to light.  Neck: Normal range of motion. Neck supple.  Cardiovascular: Normal rate, regular rhythm, normal heart sounds and intact distal pulses.   Pulmonary/Chest: Effort normal and breath sounds normal.  Abdominal: Soft. Bowel sounds are normal.  Musculoskeletal: Normal range of motion.  Neurological: She is alert  and oriented to person, place, and time. She has normal reflexes.  Skin: Skin is warm and dry.  Psychiatric: She has a normal mood and affect. Her behavior is normal. Thought content normal.      BP 123/90 mmHg   Pulse 76  Temp(Src) 97.8 F (36.6 C) (Oral)  Resp 16  Ht  (1.702 m)  Wt 209 lb (94.802 kg)  BMI 32.73 kg/m2  LMP 04/06/2015 Assessment & Plan:    1. Chronic migraine without aura without status migrainosus, not intractable Will start a trial of Topamax to assist with current headache symptoms. Discussed migraine headaches at length. Patient did not complete headache diary, will complete diary as discussed.  - topiramate (TOPAMAX) 25 MG tablet; Take 1 tablet (25 mg total) by mouth daily.  Dispense: 60 tablet; Refill: 0 - ketorolac (TORADOL) injection 60 mg; Inject 2 mLs (60 mg total) into the muscle once. - butalbital-acetaminophen-caffeine (FIORICET) 50-325-40 MG tablet; Take 1 tablet by mouth every 6 (six) hours as needed for headache.  Dispense: 60 tablet; Refill: 0  3. Generalized anxiety disorder Patient is under the care of [facility]. Denies suicidal or homicidal intent. She states that medication is minimally controlling symptoms, will increase Buspar 10 mg BID. Will follow up in 1 month.  - busPIRone (BUSPAR) 10 MG tablet; Take 1 tablet (10 mg total) by mouth 2 (two) times daily.  Dispense: 60 tablet; Refill: 2  RTC: 1 month for headache and anxiety  The patient was given clear instructions to go to ER or return to medical center if symptoms do not improve, worsen or new problems develop. The patient verbalized understanding. Will notify patient with laboratory results. Massie Maroon, FNP

## 2015-04-15 NOTE — Patient Instructions (Signed)
Migraine Headache A migraine headache is very bad, throbbing pain on one or both sides of your head. Talk to your doctor about what things may bring on (trigger) your migraine headaches. HOME CARE  Only take medicines as told by your doctor.  Lie down in a dark, quiet room when you have a migraine.  Keep a journal to find out if certain things bring on migraine headaches. For example, write down:  What you eat and drink.  How much sleep you get.  Any change to your diet or medicines.  Lessen how much alcohol you drink.  Quit smoking if you smoke.  Get enough sleep.  Lessen any stress in your life.  Keep lights dim if bright lights bother you or make your migraines worse. GET HELP RIGHT AWAY IF:   Your migraine becomes really bad.  You have a fever.  You have a stiff neck.  You have trouble seeing.  Your muscles are weak, or you lose muscle control.  You lose your balance or have trouble walking.  You feel like you will pass out (faint), or you pass out.  You have really bad symptoms that are different than your first symptoms. MAKE SURE YOU:   Understand these instructions.  Will watch your condition.  Will get help right away if you are not doing well or get worse.   This information is not intended to replace advice given to you by your health care provider. Make sure you discuss any questions you have with your health care provider.   Document Released: 10/27/2007 Document Revised: 04/11/2011 Document Reviewed: 09/24/2012 Elsevier Interactive Patient Education 2016 [employer] Generalized Anxiety Disorder Generalized anxiety disorder (GAD) is a mental disorder. It interferes with life functions, including relationships, work, and school. GAD is different from normal anxiety, which everyone experiences at some point in their lives in response to specific life events and activities. Normal anxiety actually helps us prepare for and get through these life  events and activities. Normal anxiety goes away after the event or activity is over.  GAD causes anxiety that is not necessarily related to specific events or activities. It also causes excess anxiety in proportion to specific events or activities. The anxiety associated with GAD is also difficult to control. GAD can vary from mild to severe. People with severe GAD can have intense waves of anxiety with physical symptoms (panic attacks).  SYMPTOMS The anxiety and worry associated with GAD are difficult to control. This anxiety and worry are related to many life events and activities and also occur more days than not for 6 months or longer. People with GAD also have three or more of the following symptoms (one or more in children):  Restlessness.   Fatigue.  Difficulty concentrating.   Irritability.  Muscle tension.  Difficulty sleeping or unsatisfying sleep. DIAGNOSIS GAD is diagnosed through an assessment by your health care provider. Your health care provider will ask you questions aboutyour mood,physical symptoms, and events in your life. Your health care provider may ask you about your medical history and use of alcohol or drugs, including prescription medicines. Your health care provider may also do a physical exam and blood tests. Certain medical conditions and the use of certain substances can cause symptoms similar to those associated with GAD. Your health care provider may refer you to a mental health specialist for further evaluation. TREATMENT The following therapies are usually used to treat GAD:   Medication. Antidepressant medication usually is prescribed for long-term daily  control. Antianxiety medicines may be added in severe cases, especially when panic attacks occur.   Talk therapy (psychotherapy). Certain types of talk therapy can be helpful in treating GAD by providing support, education, and guidance. A form of talk therapy called cognitive behavioral therapy can  teach you healthy ways to think about and react to daily life events and activities.  Stress managementtechniques. These include yoga, meditation, and exercise and can be very helpful when they are practiced regularly. A mental health specialist can help determine which treatment is best for you. Some people see improvement with one therapy. However, other people require a combination of therapies.   This information is not intended to replace advice given to you by your health care provider. Make sure you discuss any questions you have with your health care provider.   Document Released: 05/14/2012 Document Revised: 02/07/2014 Document Reviewed: 05/14/2012 Elsevier Interactive Patient Education [employer]

## 2015-05-01 ENCOUNTER — Emergency Department (HOSPITAL_COMMUNITY)
Admission: EM | Admit: 2015-05-01 | Discharge: 2015-05-01 | Disposition: A | Discharge status: Home / Self Care | Payer: Medicaid Other | Attending: Emergency Medicine | Admitting: Emergency Medicine

## 2015-05-01 ENCOUNTER — Encounter (HOSPITAL_COMMUNITY): Payer: Self-pay | Admitting: Emergency Medicine

## 2015-05-01 DIAGNOSIS — Y998 Other external cause status: Secondary | ICD-10-CM | POA: Insufficient documentation

## 2015-05-01 DIAGNOSIS — Y9289 Other specified places as the place of occurrence of the external cause: Secondary | ICD-10-CM | POA: Diagnosis not present

## 2015-05-01 DIAGNOSIS — M62830 Muscle spasm of back: Secondary | ICD-10-CM | POA: Diagnosis not present

## 2015-05-01 DIAGNOSIS — X58XXXA Exposure to other specified factors, initial encounter: Secondary | ICD-10-CM | POA: Insufficient documentation

## 2015-05-01 DIAGNOSIS — Z79899 Other long term (current) drug therapy: Secondary | ICD-10-CM | POA: Diagnosis not present

## 2015-05-01 DIAGNOSIS — M545 Low back pain: Secondary | ICD-10-CM | POA: Diagnosis present

## 2015-05-01 DIAGNOSIS — Z8619 Personal history of other infectious and parasitic diseases: Secondary | ICD-10-CM | POA: Insufficient documentation

## 2015-05-01 DIAGNOSIS — Y9389 Activity, other specified: Secondary | ICD-10-CM | POA: Insufficient documentation

## 2015-05-01 DIAGNOSIS — S39012A Strain of muscle, fascia and tendon of lower back, initial encounter: Secondary | ICD-10-CM | POA: Diagnosis not present

## 2015-05-01 DIAGNOSIS — I10 Essential (primary) hypertension: Secondary | ICD-10-CM | POA: Diagnosis not present

## 2015-05-01 DIAGNOSIS — G43909 Migraine, unspecified, not intractable, without status migrainosus: Secondary | ICD-10-CM | POA: Diagnosis not present

## 2015-05-01 DIAGNOSIS — M62838 Other muscle spasm: Secondary | ICD-10-CM

## 2015-05-01 MED ORDER — DIAZEPAM 5 MG PO TABS: 5.0000 mg | ORAL_TABLET | Freq: Three times a day (TID) | ORAL | Status: DC | PRN

## 2015-05-01 NOTE — ED Provider Notes (Signed)
CSN: [id number]     Arrival date & time 05/01/15  1115 History  By signing my name below, I, Essence Howell, attest that this documentation has been prepared under the direction and in the presence of Roxy Horsemanobert Yarithza Mink, PA-C Electronically Signed: Charline BillsEssence Howell, ED Scribe 05/01/2015 at 11:42 AM.   Chief Complaint  Patient presents with  . Back Pain   The history is provided by the patient. No language interpreter was used.   HPI Comments: Gina [name] is a 35 y.o. female who presents to the Emergency Department with a chief complaint of constant, non-radiating low back pain onset last night while at work. Pt does a lot of lifting and bending at work. She has tried ibuprofen without significant relief. Pt denies bladder/bowel incontinence, numbness in lower extremities. No h/o previous back pain. No known drug allergies.   Past Medical History  Diagnosis Date  . Hypertension   . Herpes genitalia   . Migraines    Past Surgical History  Procedure Laterality Date  . Tubal ligation     Family History  Problem Relation Age of Onset  . Diabetes Mother    Social History  Substance Use Topics  . Smoking status: Never Smoker   . Smokeless tobacco: None  . Alcohol Use: No   OB History    No data available     Review of Systems  Constitutional: Negative for fever and chills.  Gastrointestinal:       No bowel incontinence  Genitourinary:       No urinary incontinence  Musculoskeletal: Positive for myalgias, back pain and arthralgias.  Neurological: Negative for numbness.       No saddle anesthesia   Allergies  Review of patient's allergies indicates no known allergies.  Home Medications   Prior to Admission medications   Medication Sig Start Date End Date Taking? Authorizing Provider  busPIRone (BUSPAR) 10 MG tablet Take 1 tablet (10 mg total) by mouth 2 (two) times daily. 04/15/15   Massie MaroonLachina M Hollis, FNP  butalbital-acetaminophen-caffeine (FIORICET) [phone] MG tablet Take  1 tablet by mouth every 6 (six) hours as needed for headache. 04/15/15 04/14/16  Massie MaroonLachina M Hollis, FNP  cetirizine (ZYRTEC) 5 MG tablet Take 1 tablet (5 mg total) by mouth daily. Patient not taking: Reported on 04/15/2015 01/05/15   Massie MaroonLachina M Hollis, FNP  methocarbamol (ROBAXIN) 500 MG tablet Take 1 tablet (500 mg total) by mouth 2 (two) times daily. 04/01/15   Stevi Barrett, PA-C  topiramate (TOPAMAX) 25 MG tablet Take 1 tablet (25 mg total) by mouth daily. 04/15/15   Massie MaroonLachina M Hollis, FNP  valACYclovir (VALTREX) 1000 MG tablet Take 1 tablet (1,000 mg total) by mouth daily. 01/05/15   Massie MaroonLachina M Hollis, FNP   BP 121/89 mmHg  Pulse 77  Temp(Src) 97.8 F (36.6 C) (Oral)  Resp 16  SpO2 100%  LMP 04/06/2015 Physical Exam  Constitutional: She is oriented to person, place, and time. She appears well-developed and well-nourished. No distress.  HENT:  Head: Normocephalic and atraumatic.  Eyes: Conjunctivae and EOM are normal. Right eye exhibits no discharge. Left eye exhibits no discharge. No scleral icterus.  Neck: Normal range of motion. Neck supple. No tracheal deviation present.  Cardiovascular: Normal rate, regular rhythm and normal heart sounds.  Exam reveals no gallop and no friction rub.   No murmur heard. Pulmonary/Chest: Effort normal and breath sounds normal. No respiratory distress. She has no wheezes.  Abdominal: Soft. She exhibits no distension. There is no  tenderness.  Musculoskeletal: Normal range of motion.  Left lumbar paraspinal muscles tender to palpation, no bony tenderness, step-offs, or gross abnormality or deformity of spine, patient is able to ambulate, moves all extremities  Bilateral great toe extension intact Bilateral plantar/dorsiflexion intact  Neurological: She is alert and oriented to person, place, and time.  Sensation and strength intact bilaterally   Skin: Skin is warm. She is not diaphoretic.  Psychiatric: She has a normal mood and affect. Her behavior is normal.  Judgment and thought content normal.  Nursing note and vitals reviewed.   ED Course  Procedures (including critical care time) DIAGNOSTIC STUDIES: Oxygen Saturation is 100% on RA, normal by my interpretation.    COORDINATION OF CARE: 11:39 AM-Discussed treatment plan which includes ice, heat, Valium with pt at bedside and pt agreed to plan.    MDM   Final diagnoses:  Lumbar strain, initial encounter  Muscle spasm    Patient with back pain.  No neurological deficits and normal neuro exam.  Patient is ambulatory.  No loss of bowel or bladder control.  Doubt cauda equina.  Denies fever,  doubt epidural abscess or other lesion. Recommend back exercises, stretching, RICE, and will treat with a short course of valium.    I personally performed the services described in this documentation, which was scribed in my presence. The recorded information has been reviewed and is accurate.      Roxy Horseman, PA-C 05/01/15 1145  Donnetta Hutching, MD 05/01/15 1535

## 2015-05-01 NOTE — Discharge Instructions (Signed)
Muscle Cramps and Spasms °Muscle cramps and spasms occur when a muscle or muscles tighten and you have no control over this tightening (involuntary muscle contraction). They are a common problem and can develop in any muscle. The most common place is in the calf muscles of the leg. Both muscle cramps and muscle spasms are involuntary muscle contractions, but they also have differences:  °· Muscle cramps are sporadic and painful. They may last a few seconds to a quarter of an hour. Muscle cramps are often more forceful and last longer than muscle spasms. °· Muscle spasms may or may not be painful. They may also last just a few seconds or much longer. °CAUSES  °It is uncommon for cramps or spasms to be due to a serious underlying problem. In many cases, the cause of cramps or spasms is unknown. Some common causes are:  °· Overexertion.   °· Overuse from repetitive motions (doing the same thing over and over).   °· Remaining in a certain position for a long period of time.   °· Improper preparation, form, or technique while performing a sport or activity.   °· Dehydration.   °· Injury.   °· Side effects of some medicines.   °· Abnormally low levels of the salts and ions in your blood (electrolytes), especially potassium and calcium. This could happen if you are taking water pills (diuretics) or you are pregnant.   °Some underlying medical problems can make it more likely to develop cramps or spasms. These include, but are not limited to:  °· Diabetes.   °· Parkinson disease.   °· Hormone disorders, such as thyroid problems.   °· Alcohol abuse.   °· Diseases specific to muscles, joints, and bones.   °· Blood vessel disease where not enough blood is getting to the muscles.   °HOME CARE INSTRUCTIONS  °· Stay well hydrated. Drink enough water and fluids to keep your urine clear or pale yellow. °· It may be helpful to massage, stretch, and relax the affected muscle. °· For tight or tense muscles, use a warm towel, heating  pad, or hot shower water directed to the affected area. °· If you are sore or have pain after a cramp or spasm, applying ice to the affected area may relieve discomfort. °¨ Put ice in a plastic bag. °¨ Place a towel between your skin and the bag. °¨ Leave the ice on for 15-20 minutes, 03-04 times a day. °· Medicines used to treat a known cause of cramps or spasms may help reduce their frequency or severity. Only take over-the-counter or prescription medicines as directed by your caregiver. °SEEK MEDICAL CARE IF:  °Your cramps or spasms get more severe, more frequent, or do not improve over time.  °MAKE SURE YOU:  °· Understand these instructions. °· Will watch your condition. °· Will get help right away if you are not doing well or get worse. °  °This information is not intended to replace advice given to you by your health care provider. Make sure you discuss any questions you have with your health care provider. °  °Document Released: 07/09/2001 Document Revised: 05/14/2012 Document Reviewed: 01/04/2012 °Elsevier Interactive Patient Education ©2016 Else[employer] Inc[employer] °Muscle Strain °A muscle strain is an injury that occurs when a muscle is stretched beyond its normal length. Usually a small number of muscle fibers are torn when this happens. Muscle strain is rated in degrees. First-degree strains have the least amount of muscle fiber tearing and pain. Second-degree and third-degree strains have increasingly more tearing and pain.  °Usually, recovery from muscle strain   takes 1-2 weeks. Complete healing takes 5-6 weeks.  °CAUSES  °Muscle strain happens when a sudden, violent force placed on a muscle stretches it too far. This may occur with lifting, sports, or a fall.  °RISK FACTORS °Muscle strain is especially common in athletes.  °SIGNS AND SYMPTOMS °At the site of the muscle strain, there may be: °· Pain. °· Bruising. °· Swelling. °· Difficulty using the muscle due to pain or lack of normal function. °DIAGNOSIS  °Your  health care provider will perform a physical exam and ask about your medical history. °TREATMENT  °Often, the best treatment for a muscle strain is resting, icing, and applying cold compresses to the injured area.   °HOME CARE INSTRUCTIONS  °· Use the PRICE method of treatment to promote muscle healing during the first 2-3 days after your injury. The PRICE method involves: °· Protecting the muscle from being injured again. °· Restricting your activity and resting the injured body part. °· Icing your injury. To do this, put ice in a plastic bag. Place a towel between your skin and the bag. Then, apply the ice and leave it on from 15-20 minutes each hour. After the third day, switch to moist heat packs. °· Apply compression to the injured area with a splint or elastic bandage. Be careful not to wrap it too tightly. This may interfere with blood circulation or increase swelling. °· Elevate the injured body part above the level of your heart as often as you can. °· Only take over-the-counter or prescription medicines for pain, discomfort, or fever as directed by your health care provider. °· Warming up prior to exercise helps to prevent future muscle strains. °SEEK MEDICAL CARE IF:  °· You have increasing pain or swelling in the injured area. °· You have numbness, tingling, or a significant loss of strength in the injured area. °MAKE SURE YOU:  °· Understand these instructions. °· Will watch your condition. °· Will get help right away if you are not doing well or get worse. °  °This information is not intended to replace advice given to you by your health care provider. Make sure you discuss any questions you have with your health care provider. °  °Document Released: 01/17/2005 Document Revised: 11/07/2012 Document Reviewed: 08/16/2012 °Elsevier Interactive Patient Education ©2016 [employer] ° °

## 2015-05-01 NOTE — ED Notes (Signed)
C/o LBP since last pm while at work. Hx of LBP in past.

## 2015-05-13 ENCOUNTER — Ambulatory Visit: Payer: Medicaid Other | Admitting: Family Medicine

## 2015-05-22 ENCOUNTER — Ambulatory Visit (HOSPITAL_COMMUNITY)
Admission: RE | Admit: 2015-05-22 | Discharge: 2015-05-22 | Disposition: A | Discharge status: Home / Self Care | Payer: Medicaid Other | Source: Ambulatory Visit | Attending: Family Medicine | Admitting: Family Medicine

## 2015-05-22 ENCOUNTER — Encounter: Payer: Self-pay | Admitting: Family Medicine

## 2015-05-22 ENCOUNTER — Ambulatory Visit (INDEPENDENT_AMBULATORY_CARE_PROVIDER_SITE_OTHER): Payer: Medicaid Other | Admitting: Family Medicine

## 2015-05-22 VITALS — BP 125/79 | HR 77 | Temp 98.2°F | Ht 67.0 in | Wt 214.0 lb

## 2015-05-22 DIAGNOSIS — R51 Headache: Secondary | ICD-10-CM | POA: Diagnosis not present

## 2015-05-22 DIAGNOSIS — M545 Low back pain, unspecified: Secondary | ICD-10-CM

## 2015-05-22 DIAGNOSIS — G43709 Chronic migraine without aura, not intractable, without status migrainosus: Secondary | ICD-10-CM | POA: Diagnosis not present

## 2015-05-22 DIAGNOSIS — F411 Generalized anxiety disorder: Secondary | ICD-10-CM | POA: Diagnosis not present

## 2015-05-22 DIAGNOSIS — M62830 Muscle spasm of back: Secondary | ICD-10-CM

## 2015-05-22 DIAGNOSIS — R519 Headache, unspecified: Secondary | ICD-10-CM

## 2015-05-22 MED ORDER — TOPIRAMATE 25 MG PO TABS: 25.0000 mg | ORAL_TABLET | Freq: Two times a day (BID) | ORAL | Status: DC

## 2015-05-22 MED ORDER — CYCLOBENZAPRINE HCL 5 MG PO TABS: 5.0000 mg | ORAL_TABLET | Freq: Three times a day (TID) | ORAL | Status: DC | PRN

## 2015-05-22 MED ORDER — BUSPIRONE HCL 10 MG PO TABS: 10.0000 mg | ORAL_TABLET | Freq: Two times a day (BID) | ORAL | Status: DC

## 2015-05-22 MED ORDER — BUTALBITAL-APAP-CAFFEINE 50-325-40 MG PO TABS: 1.0000 | ORAL_TABLET | Freq: Four times a day (QID) | ORAL | Status: DC | PRN

## 2015-05-22 NOTE — Progress Notes (Signed)
Subjective:    Patient ID: Gina [name], female    DOB: 23-Oct-1980, 35 y.o.   MRN: [id number]  HPI Gina [name], a 35 year old female presents for a follow up of tension headaches and anxiety. Patient continues to complain of periodic headaches. She has been maintaining a headache diary. She states that medications are providing some relief for migraine headaches.  Symptoms began  several years ago and typically occur daily. Generally the headache lasts several hours and is primarily to the front of head bilaterally. She states that she has been taking Fioricet with moderate relief. The headache do not seem to be related to any time of the day. The headaches are usually dull and are bilateral in location. Recently, the headaches are stable.  No precipitating factors have been identified. Gina [name] denies depression, dizziness, loss of balance, muscle weakness, numbness of extremities, speech difficulties, vision problems and vomiting in the early morning.Other associated symptoms include: photophobia.  Symptoms which are not present include: abdominal pain, appetite decrease, diarrhea, fever, hoarseness, irritability, nasal congestion and neck stiffness. Home treatment has included acetaminophen and Fiorcet and darkening the room with adequate improvement.   She is also complaining of anxiety. She states that headaches are mostly associated with increased stress. She maintains that her primary trigger for headaches and anxiety is stress. Symptoms of anxiety include difficulty concentrating, constantly worrying, irritability and easily annoyed.  She currently denies suicidal or homicidal intent. She is [facility] for counseling. She states that she was referred to a psychiatrist, her appointment is scheduled for March.   Patient is complaining of low back pain with spasms over the past several weeks. She says that she was lifting a heaving box at work and sustained an  injury to low back. She was evaluated in emergency department on 05/01/2015. She says that she was given muscle relaxers and Ibuprofen with unsatisfactory relief. She continues to have back pain. Current pain intensity is 4/10 describes as intermittent and aching.   Exacerbating factors identifiable by patient are recumbency and standing.   Past Medical History  Diagnosis Date  . Hypertension   . Herpes genitalia   . Migraines    Social History   Social History  . Marital Status: Single    Spouse Name: N/A  . Number of Children: N/A  . Years of Education: N/A   Occupational History  . Not on file.   Social History Main Topics  . Smoking status: Never Smoker   . Smokeless tobacco: Not on file  . Alcohol Use: No  . Drug Use: No  . Sexual Activity: Yes    Birth Control/ Protection: Condom   Other Topics Concern  . Not on file   Social History Narrative   Review of Systems  Constitutional: Negative for fatigue.  HENT: Negative for congestion.   Eyes: Negative.   Respiratory: Negative.  Negative for wheezing.   Cardiovascular: Negative.  Negative for chest pain, palpitations and leg swelling.  Gastrointestinal: Negative.   Endocrine: Negative.  Negative for polydipsia, polyphagia and polyuria.  Genitourinary: Negative.   Musculoskeletal: Positive for back pain. Negative for neck pain and neck stiffness.  Skin: Negative.   Allergic/Immunologic: Negative for immunocompromised state.  Neurological: Positive for headaches. Negative for dizziness.  Hematological: Negative.   Psychiatric/Behavioral: Negative.  Negative for suicidal ideas and sleep disturbance.       Objective:   Physical Exam  Constitutional: She is oriented to person, place, and  time. She appears well-developed and well-nourished.  HENT:  Head: Normocephalic and atraumatic.  Right Ear: External ear normal.  Left Ear: External ear normal.  Mouth/Throat: Oropharynx is clear and moist.  Eyes: Conjunctivae  and EOM are normal. Pupils are equal, round, and reactive to light.  Neck: Normal range of motion. Neck supple.  Cardiovascular: Normal rate, regular rhythm, normal heart sounds and intact distal pulses.   Pulmonary/Chest: Effort normal and breath sounds normal.  Abdominal: Soft. Bowel sounds are normal.  Musculoskeletal:       Lumbar back: She exhibits decreased range of motion, pain and spasm.  Neurological: She is alert and oriented to person, place, and time. She has normal reflexes.  Skin: Skin is warm and dry.  Psychiatric: She has a normal mood and affect. Her behavior is normal. Thought content normal.      BP 125/79 mmHg  Pulse 77  Temp(Src) 98.2 F (36.8 C) (Oral)  Ht  (1.702 m)  Wt 214 lb (97.07 kg)  BMI 33.51 kg/m2  SpO2 100%  LMP 05/21/2015 Assessment & Plan:  1. Bilateral low back pain without sciatica Recommend heat therapy 20 minutes 4 times per day, use interchangeably with cold packs. Use back stretches to assist - DG Lumbar Spine Complete; Future  2. Nonintractable headache, unspecified chronicity pattern, unspecified headache type - butalbital-acetaminophen-caffeine (FIORICET) 50-325-40 MG tablet; Take 1 tablet by mouth every 6 (six) hours as needed for headache.  Dispense: 60 tablet; Refill: 1  3. Generalized anxiety disorder Patient is under the care of [facility]. Denies suicidal or homicidal intent- busPIRone (BUSPAR) 10 MG tablet; Take 1 tablet (10 mg total) by mouth 2 (two) times daily.  Dispense: 60 tablet; Refill: 2  4. Chronic migraine without aura without status migrainosus, not intractable - topiramate (TOPAMAX) 25 MG tablet; Take 1 tablet (25 mg total) by mouth 2 (two) times daily.  Dispense: 60 tablet; Refill: 1  5. Spasm of back muscles - cyclobenzaprine (FLEXERIL) 5 MG tablet; Take 1 tablet (5 mg total) by mouth 3 (three) times daily as needed for muscle spasms.  Dispense: 20 tablet; Refill: 0   RTC: 3  months for headache  and anxiety. Will follow up with patient by phone with xray results  The patient was given clear instructions to go to ER or return to medical center if symptoms do not improve, worsen or new problems develop. The patient verbalized understanding. Will notify patient with laboratory results. Massie Maroon, FNP

## 2015-05-22 NOTE — Patient Instructions (Signed)
Heat Therapy °Heat therapy can help ease sore, stiff, injured, and tight muscles and joints. Heat relaxes your muscles, which may help ease your pain.  °RISKS AND COMPLICATIONS °If you have any of the following conditions, do not use heat therapy unless your health care provider has approved: °· Poor circulation. °· Healing wounds or scarred skin in the area being treated. °· Diabetes, heart disease, or high blood pressure. °· Not being able to feel (numbness) the area being treated. °· Unusual swelling of the area being treated. °· Active infections. °· Blood clots. °· Cancer. °· Inability to communicate pain. This may include young children and people who have problems with their brain function (dementia). °· Pregnancy. °Heat therapy should only be used on old, pre-existing, or long-lasting (chronic) injuries. Do not use heat therapy on new injuries unless directed by your health care provider. °HOW TO USE HEAT THERAPY °There are several different kinds of heat therapy, including: °· Moist heat pack. °· Warm water bath. °· Hot water bottle. °· Electric heating pad. °· Heated gel pack. °· Heated wrap. °· Electric heating pad. °Use the heat therapy method suggested by your health care provider. Follow your health care provider's instructions on when and how to use heat therapy. °GENERAL HEAT THERAPY RECOMMENDATIONS °· Do not sleep while using heat therapy. Only use heat therapy while you are awake. °· Your skin may turn pink while using heat therapy. Do not use heat therapy if your skin turns red. °· Do not use heat therapy if you have new pain. °· High heat or long exposure to heat can cause burns. Be careful when using heat therapy to avoid burning your skin. °· Do not use heat therapy on areas of your skin that are already irritated, such as with a rash or sunburn. °SEEK MEDICAL CARE IF: °· You have blisters, redness, swelling, or numbness. °· You have new pain. °· Your pain is worse. °MAKE SURE  YOU: °· Understand these instructions. °· Will watch your condition. °· Will get help right away if you are not doing well or get worse. °  °This information is not intended to replace advice given to you by your health care provider. Make sure you discuss any questions you have with your health care provider. °  °Document Released: 04/11/2011 Document Revised: 02/07/2014 Document Reviewed: 03/12/2013 °Elsevier Interactive Patient Education ©2016 [employer] °Back Pain, Adult °Back pain is very common in adults. The cause of back pain is rarely dangerous and the pain often gets better over time. The cause of your back pain may not be known. Some common causes of back pain include: °· Strain of the muscles or ligaments supporting the spine. °· Wear and tear (degeneration) of the spinal disks. °· Arthritis. °· Direct injury to the back. °For many people, back pain may return. Since back pain is rarely dangerous, most people can learn to manage this condition on their own. °HOME CARE INSTRUCTIONS °Watch your back pain for any changes. The following actions may help to lessen any discomfort you are feeling: °· Remain active. It is stressful on your back to sit or stand in one place for long periods of time. Do not sit, drive, or stand in one place for more than 30 minutes at a time. Take short walks on even surfaces as soon as you are able. Try to increase the length of time you walk each day. °· Exercise regularly as directed by your health care provider. Exercise helps your back heal faster. It also   helps avoid future injury by keeping your muscles strong and flexible. °· Do not stay in bed. Resting more than 1-2 days can delay your recovery. °· Pay attention to your body when you bend and lift. The most comfortable positions are those that put less stress on your recovering back. Always use proper lifting techniques, including: °¨ Bending your knees. °¨ Keeping the load close to your body. °¨ Avoiding  twisting. °· Find a comfortable position to sleep. Use a firm mattress and lie on your side with your knees slightly bent. If you lie on your back, put a pillow under your knees. °· Avoid feeling anxious or stressed. Stress increases muscle tension and can worsen back pain. It is important to recognize when you are anxious or stressed and learn ways to manage it, such as with exercise. °· Take medicines only as directed by your health care provider. Over-the-counter medicines to reduce pain and inflammation are often the most helpful. Your health care provider may prescribe muscle relaxant drugs. These medicines help dull your pain so you can more quickly return to your normal activities and healthy exercise. °· Apply ice to the injured area: °¨ Put ice in a plastic bag. °¨ Place a towel between your skin and the bag. °¨ Leave the ice on for 20 minutes, 2-3 times a day for the first 2-3 days. After that, ice and heat may be alternated to reduce pain and spasms. °· Maintain a healthy weight. Excess weight puts extra stress on your back and makes it difficult to maintain good posture. °SEEK MEDICAL CARE IF: °· You have pain that is not relieved with rest or medicine. °· You have increasing pain going down into the legs or buttocks. °· You have pain that does not improve in one week. °· You have night pain. °· You lose weight. °· You have a fever or chills. °SEEK IMMEDIATE MEDICAL CARE IF:  °· You develop new bowel or bladder control problems. °· You have unusual weakness or numbness in your arms or legs. °· You develop nausea or vomiting. °· You develop abdominal pain. °· You feel faint. °  °This information is not intended to replace advice given to you by your health care provider. Make sure you discuss any questions you have with your health care provider. °  °Document Released: 01/17/2005 Document Revised: 02/07/2014 Document Reviewed: 05/21/2013 °Elsevier Interactive Patient Education ©2016 Elsevier Inc. ° °

## 2015-06-26 ENCOUNTER — Emergency Department (HOSPITAL_COMMUNITY)
Admission: EM | Admit: 2015-06-26 | Discharge: 2015-06-26 | Disposition: A | Discharge status: Home / Self Care | Payer: Medicaid Other | Attending: Emergency Medicine | Admitting: Emergency Medicine

## 2015-06-26 ENCOUNTER — Encounter (HOSPITAL_COMMUNITY): Payer: Self-pay | Admitting: Emergency Medicine

## 2015-06-26 DIAGNOSIS — G43709 Chronic migraine without aura, not intractable, without status migrainosus: Secondary | ICD-10-CM | POA: Diagnosis not present

## 2015-06-26 DIAGNOSIS — I10 Essential (primary) hypertension: Secondary | ICD-10-CM | POA: Diagnosis not present

## 2015-06-26 DIAGNOSIS — Z79899 Other long term (current) drug therapy: Secondary | ICD-10-CM | POA: Diagnosis not present

## 2015-06-26 DIAGNOSIS — G43909 Migraine, unspecified, not intractable, without status migrainosus: Secondary | ICD-10-CM | POA: Diagnosis present

## 2015-06-26 LAB — BASIC METABOLIC PANEL
Anion gap: 7 (ref 5–15)
BUN: 12 mg/dL (ref 6–20)
CO2: 24 mmol/L (ref 22–32)
CREATININE: 0.91 mg/dL (ref 0.44–1.00)
Calcium: 9 mg/dL (ref 8.9–10.3)
Chloride: 104 mmol/L (ref 101–111)
Glucose, Bld: 104 mg/dL — ABNORMAL HIGH (ref 65–99)
Potassium: 3.3 mmol/L — ABNORMAL LOW (ref 3.5–5.1)
SODIUM: 135 mmol/L (ref 135–145)

## 2015-06-26 LAB — CBC WITH DIFFERENTIAL/PLATELET
BASOS ABS: 0 10*3/uL (ref 0.0–0.1)
Basophils Relative: 0 %
EOS ABS: 0.1 10*3/uL (ref 0.0–0.7)
EOS PCT: 4 %
HCT: 37.2 % (ref 36.0–46.0)
Hemoglobin: 12.9 g/dL (ref 12.0–15.0)
Lymphocytes Relative: 59 %
Lymphs Abs: 2.4 10*3/uL (ref 0.7–4.0)
MCH: 29.8 pg (ref 26.0–34.0)
MCHC: 34.7 g/dL (ref 30.0–36.0)
MCV: 85.9 fL (ref 78.0–100.0)
Monocytes Absolute: 0.4 10*3/uL (ref 0.1–1.0)
Monocytes Relative: 10 %
NEUTROS PCT: 27 %
Neutro Abs: 1.1 10*3/uL — ABNORMAL LOW (ref 1.7–7.7)
PLATELETS: 259 10*3/uL (ref 150–400)
RBC: 4.33 MIL/uL (ref 3.87–5.11)
RDW: 12.7 % (ref 11.5–15.5)
WBC: 4.1 10*3/uL (ref 4.0–10.5)

## 2015-06-26 MED ORDER — SODIUM CHLORIDE 0.9 % IV BOLUS (SEPSIS)
1000.0000 mL | Freq: Once | INTRAVENOUS | Status: AC
  Administered 2015-06-26: 1000 mL via INTRAVENOUS

## 2015-06-26 MED ORDER — KETOROLAC TROMETHAMINE 30 MG/ML IJ SOLN
15.0000 mg | Freq: Once | INTRAMUSCULAR | Status: AC
  Administered 2015-06-26: 15 mg via INTRAVENOUS
  Filled 2015-06-26: qty 1

## 2015-06-26 MED ORDER — PROCHLORPERAZINE EDISYLATE 5 MG/ML IJ SOLN
10.0000 mg | Freq: Once | INTRAMUSCULAR | Status: AC
  Administered 2015-06-26: 10 mg via INTRAVENOUS
  Filled 2015-06-26: qty 2

## 2015-06-26 MED ORDER — DIPHENHYDRAMINE HCL 50 MG/ML IJ SOLN
12.5000 mg | Freq: Once | INTRAMUSCULAR | Status: AC
  Administered 2015-06-26: 12.5 mg via INTRAVENOUS
  Filled 2015-06-26: qty 1

## 2015-06-26 NOTE — Discharge Instructions (Signed)
Recurrent Migraine Headache A migraine headache is an intense, throbbing pain on one or both sides of your head. Recurrent migraines keep coming back. A migraine can last for 30 minutes to several hours. CAUSES  The exact cause of a migraine headache is not always known. However, a migraine may be caused when nerves in the brain become irritated and release chemicals that cause inflammation. This causes pain. Certain things may also trigger migraines, such as:   Alcohol.  Smoking.  Stress.  Menstruation.  Aged cheeses.  Foods or drinks that contain nitrates, glutamate, aspartame, or tyramine.  Lack of sleep.  Chocolate.  Caffeine.  Hunger.  Physical exertion.  Fatigue.  Medicines used to treat chest pain (nitroglycerine), birth control pills, estrogen, and some blood pressure medicines. SYMPTOMS   Pain on one or both sides of your head.  Pulsating or throbbing pain.  Severe pain that prevents daily activities.  Pain that is aggravated by any physical activity.  Nausea, vomiting, or both.  Dizziness.  Pain with exposure to bright lights, loud noises, or activity.  General sensitivity to bright lights, loud noises, or smells. Before you get a migraine, you may get warning signs that a migraine is coming (aura). An aura may include:  Seeing flashing lights.  Seeing bright spots, halos, or zigzag lines.  Having tunnel vision or blurred vision.  Having feelings of numbness or tingling.  Having trouble talking.  Having muscle weakness. DIAGNOSIS  A recurrent migraine headache is often diagnosed based on:  Symptoms.  Physical examination.  A CT scan or MRI of your head. These imaging tests cannot diagnose migraines but can help rule out other causes of headaches.  TREATMENT  Medicines may be given for pain and nausea. Medicines can also be given to help prevent recurrent migraines. HOME CARE INSTRUCTIONS  Only take over-the-counter or prescription  medicines for pain or discomfort as directed by your health care provider. The use of long-term narcotics is not recommended.  Lie down in a dark, quiet room when you have a migraine.  Keep a journal to find out what may trigger your migraine headaches. For example, write down:  What you eat and drink.  How much sleep you get.  Any change to your diet or medicines.  Limit alcohol consumption.  Quit smoking if you smoke.  Get 7-9 hours of sleep, or as recommended by your health care provider.  Limit stress.  Keep lights dim if bright lights bother you and make your migraines worse. SEEK MEDICAL CARE IF:   You do not get relief from the medicines given to you.  You have a recurrence of pain.  You have a fever. SEEK IMMEDIATE MEDICAL CARE IF:  Your migraine becomes severe.  You have a stiff neck.  You have loss of vision.  You have muscular weakness or loss of muscle control.  You start losing your balance or have trouble walking.  You feel faint or pass out.  You have severe symptoms that are different from your first symptoms. MAKE SURE YOU:   Understand these instructions.  Will watch your condition.  Will get help right away if you are not doing well or get worse.   This information is not intended to replace advice given to you by your health care provider. Make sure you discuss any questions you have with your health care provider.   Document Released: 10/12/2000 Document Revised: 02/07/2014 Document Reviewed: 09/24/2012 Elsevier Interactive Patient Education 2016 Elsevier Inc.  

## 2015-06-26 NOTE — ED Notes (Signed)
Pt reports migraine has not been relieved by home medications. Pain worse on L side. Accompanied by light sensitivity and emesis.

## 2015-06-26 NOTE — ED Provider Notes (Signed)
CSN: [id number]     Arrival date & time 06/26/15  0940 History   First MD Initiated Contact with Patient 06/26/15 [phone]     Chief Complaint  Patient presents with  . Migraine      HPI Patient presents with left-sided migraine headache.  Been taking her migraine medications without relief.  Has had nausea and vomiting.  Similar to migraines in the past.  Denies fever chills. Past Medical History  Diagnosis Date  . Hypertension   . Herpes genitalia   . Migraines    Past Surgical History  Procedure Laterality Date  . Tubal ligation     Family History  Problem Relation Age of Onset  . Diabetes Mother    Social History  Substance Use Topics  . Smoking status: Never Smoker   . Smokeless tobacco: None  . Alcohol Use: No   OB History    No data available     Review of Systems  All other systems reviewed and are negative  Allergies  Review of patient's allergies indicates no known allergies.  Home Medications   Prior to Admission medications   Medication Sig Start Date End Date Taking? Authorizing Provider  Acetaminophen-Caffeine (TENSION HEADACHE) 500-65 MG TABS Take 2 tablets by mouth 3 (three) times daily as needed (headaches).   Yes Historical Provider, MD  busPIRone (BUSPAR) 10 MG tablet Take 1 tablet (10 mg total) by mouth 2 (two) times daily. 05/22/15  Yes Massie MaroonLachina M Hollis, FNP  cyclobenzaprine (FLEXERIL) 5 MG tablet Take 1 tablet (5 mg total) by mouth 3 (three) times daily as needed for muscle spasms. 05/22/15  Yes Massie MaroonLachina M Hollis, FNP  diazepam (VALIUM) 5 MG tablet Take 1 tablet (5 mg total) by mouth every 8 (eight) hours as needed for anxiety. 05/01/15  Yes Roxy Horsemanobert Browning, PA-C  butalbital-acetaminophen-caffeine (FIORICET) 50-325-40 MG tablet Take 1 tablet by mouth every 6 (six) hours as needed for headache. Patient not taking: Reported on 06/26/2015 05/22/15 05/21/16  Massie MaroonLachina M Hollis, FNP  topiramate (TOPAMAX) 25 MG tablet Take 1 tablet (25 mg total) by mouth 2 (two)  times daily. Patient not taking: Reported on 06/26/2015 05/22/15   Massie MaroonLachina M Hollis, FNP  valACYclovir (VALTREX) 1000 MG tablet Take 1 tablet (1,000 mg total) by mouth daily. Patient not taking: Reported on 06/26/2015 01/05/15   Massie MaroonLachina M Hollis, FNP   BP 130/92 mmHg  Pulse 66  Temp(Src) 98.1 F (36.7 C) (Oral)  Resp 16  Ht 5\' 7"  (1.702 m)  Wt 211 lb (95.709 kg)  BMI 33.04 kg/m2  SpO2 100% Physical Exam Physical Exam  Nursing note and vitals reviewed. Constitutional: She is oriented to person, place, and time. She appears well-developed and well-nourished. No distress.  HENT:  Head: Normocephalic and atraumatic.  Eyes: Pupils are equal, round, and reactive to light.  Neck: Normal range of motion.  Cardiovascular: Normal rate and intact distal pulses.   Pulmonary/Chest: No respiratory distress.  Abdominal: Normal appearance. She exhibits no distension.  Musculoskeletal: Normal range of motion.  Neurological: She is alert and oriented to person, place, and time. No cranial nerve deficit.  Skin: Skin is warm and dry. No rash noted.  Psychiatric: She has a normal mood and affect. Her behavior is normal.   ED Course  Procedures (including critical care time) Medications  sodium chloride 0.9 % bolus 1,000 mL (0 mLs Intravenous Stopped 06/26/15 1221)  prochlorperazine (COMPAZINE) injection 10 mg (10 mg Intravenous Given 06/26/15 1041)  diphenhydrAMINE (BENADRYL) injection 12.5 mg (  12.5 mg Intravenous Given 06/26/15 1041)  ketorolac (TORADOL) 30 MG/ML injection 15 mg (15 mg Intravenous Given 06/26/15 1042)   Patient states she had a normal CT scan of her head within the last year. Labs Review Labs Reviewed  BASIC METABOLIC PANEL - Abnormal; Notable for the following:    Potassium 3.3 (*)    Glucose, Bld 104 (*)    All other components within normal limits  CBC WITH DIFFERENTIAL/PLATELET - Abnormal; Notable for the following:    Neutro Abs 1.1 (*)    All other components within normal  limits    After treatment in the ED the patient feels back to baseline and wants to go home.   MDM   Final diagnoses:  Chronic migraine without aura without status migrainosus, not intractable        Nelva Nay, MD 06/26/15 1235

## 2015-07-22 ENCOUNTER — Encounter (HOSPITAL_COMMUNITY): Payer: Self-pay | Admitting: Emergency Medicine

## 2015-07-22 ENCOUNTER — Emergency Department (HOSPITAL_COMMUNITY)
Admission: EM | Admit: 2015-07-22 | Discharge: 2015-07-22 | Disposition: A | Discharge status: Home / Self Care | Payer: Medicaid Other | Attending: Emergency Medicine | Admitting: Emergency Medicine

## 2015-07-22 ENCOUNTER — Other Ambulatory Visit: Payer: Self-pay

## 2015-07-22 ENCOUNTER — Emergency Department (HOSPITAL_COMMUNITY): Payer: Medicaid Other

## 2015-07-22 DIAGNOSIS — Z79899 Other long term (current) drug therapy: Secondary | ICD-10-CM | POA: Insufficient documentation

## 2015-07-22 DIAGNOSIS — I10 Essential (primary) hypertension: Secondary | ICD-10-CM | POA: Insufficient documentation

## 2015-07-22 DIAGNOSIS — R079 Chest pain, unspecified: Secondary | ICD-10-CM | POA: Insufficient documentation

## 2015-07-22 DIAGNOSIS — G43909 Migraine, unspecified, not intractable, without status migrainosus: Secondary | ICD-10-CM | POA: Insufficient documentation

## 2015-07-22 LAB — CBC
HCT: 40.7 % (ref 36.0–46.0)
Hemoglobin: 14.1 g/dL (ref 12.0–15.0)
MCH: 30.3 pg (ref 26.0–34.0)
MCHC: 34.6 g/dL (ref 30.0–36.0)
MCV: 87.3 fL (ref 78.0–100.0)
Platelets: 290 10*3/uL (ref 150–400)
RBC: 4.66 MIL/uL (ref 3.87–5.11)
RDW: 13 % (ref 11.5–15.5)
WBC: 6.4 10*3/uL (ref 4.0–10.5)

## 2015-07-22 LAB — BASIC METABOLIC PANEL
Anion gap: 9 (ref 5–15)
BUN: 12 mg/dL (ref 6–20)
CO2: 23 mmol/L (ref 22–32)
Calcium: 9.4 mg/dL (ref 8.9–10.3)
Chloride: 104 mmol/L (ref 101–111)
Creatinine, Ser: 0.83 mg/dL (ref 0.44–1.00)
GFR calc Af Amer: >60 mL/min (ref >60)
GFR calc non Af Amer: >60 mL/min (ref >60)
Glucose, Bld: 91 mg/dL (ref 65–99)
Potassium: 4.3 mmol/L (ref 3.5–5.1)
Sodium: 136 mmol/L (ref 135–145)

## 2015-07-22 LAB — I-STAT TROPONIN, ED: Troponin i, poc: 0 ng/mL (ref 0.00–0.08)

## 2015-07-22 MED ORDER — DEXAMETHASONE SODIUM PHOSPHATE 10 MG/ML IJ SOLN
10.0000 mg | Freq: Once | INTRAMUSCULAR | Status: AC
  Administered 2015-07-22: 10 mg via INTRAMUSCULAR
  Filled 2015-07-22: qty 1

## 2015-07-22 MED ORDER — METOCLOPRAMIDE HCL 10 MG PO TABS
10.0000 mg | ORAL_TABLET | Freq: Once | ORAL | Status: AC
  Administered 2015-07-22: 10 mg via ORAL
  Filled 2015-07-22: qty 1

## 2015-07-22 MED ORDER — KETOROLAC TROMETHAMINE 60 MG/2ML IM SOLN
60.0000 mg | Freq: Once | INTRAMUSCULAR | Status: AC
  Administered 2015-07-22: 60 mg via INTRAMUSCULAR
  Filled 2015-07-22: qty 2

## 2015-07-22 MED ORDER — DIPHENHYDRAMINE HCL 25 MG PO CAPS
50.0000 mg | ORAL_CAPSULE | Freq: Once | ORAL | Status: AC
  Administered 2015-07-22: 50 mg via ORAL
  Filled 2015-07-22: qty 2

## 2015-07-22 MED ORDER — DIAZEPAM 5 MG PO TABS: 5.0000 mg | ORAL_TABLET | Freq: Three times a day (TID) | ORAL | Status: DC | PRN

## 2015-07-22 NOTE — ED Notes (Signed)
Okay to put pt in lobby with IV per charge

## 2015-07-22 NOTE — ED Notes (Signed)
PT DISCHARGED. INSTRUCTIONS AND PRESCRIPTION GIVEN. AAOX4. PT IN NO APPARENT DISTRESS. THE OPPORTUNITY TO ASK QUESTIONS WAS PROVIDED. 

## 2015-07-22 NOTE — ED Provider Notes (Signed)
CSN: [id number]     Arrival date & time 07/22/15  1623 History   By signing my name below, I, Marisue Humble, attest that this documentation has been prepared under the direction and in the presence of non-physician practitioner, Trixie Dredge, PA-C. Electronically Signed: Marisue Humble, Scribe. 07/22/2015. 9:26 PM.   Chief Complaint  Patient presents with  . Headache    The history is provided by the patient and a relative. No language interpreter was used.   HPI Comments:  Gina [name] is a 35 y.o. female with PMHx of HTN and migraines who presents to the Emergency Department via EMS complaining of worsening, pounding headache across forehead onset last night. Pain was a 10/10 this morning and is currently improved and is now mild. She took 6 OTC tension headache medication earlier today without relief. Relative states pt was laying in bed earlier today, awake but not responding or moving. Pt states she remembers the ambulance coming to the house; she states she was in so much pain she could not respond or move. Pt was seen in the ED ~1 month ago for a typical migraine; she was treated and referred to a neurologist. Pt has an appointment next month to see the neurologist. Pt states she has severe headaches with nausea, vomiting and photophobia 3-4 days per week. Pt has taken Fioricet as prescribed by PCP without relief. Pt also reports left-side chest pain onset in the waiting room. Pain is currently sharp and worse when laying down. Pt also notes lower abdominal soreness consistent with the first day of her period. Denies abdominal pain, vomiting, diarrhea, constipation, fever, chills, hematuria, dysuria, or bloody stools.   Past Medical History  Diagnosis Date  . Hypertension   . Herpes genitalia   . Migraines    Past Surgical History  Procedure Laterality Date  . Tubal ligation     Family History  Problem Relation Age of Onset  . Diabetes Mother    Social History  Substance Use  Topics  . Smoking status: Never Smoker   . Smokeless tobacco: None  . Alcohol Use: No   OB History    No data available     Review of Systems  Constitutional: Negative for fever and chills.  Cardiovascular: Positive for chest pain.  Gastrointestinal: Positive for nausea. Negative for vomiting, abdominal pain, diarrhea, constipation and blood in stool.  Genitourinary: Negative for dysuria and hematuria.  Neurological: Positive for headaches.  All other systems reviewed and are negative.   Allergies  Review of patient's allergies indicates no known allergies.  Home Medications   Prior to Admission medications   Medication Sig Start Date End Date Taking? Authorizing Provider  Acetaminophen-Caffeine (TENSION HEADACHE) 500-65 MG TABS Take 2 tablets by mouth 3 (three) times daily as needed (headaches).    Historical Provider, MD  busPIRone (BUSPAR) 10 MG tablet Take 1 tablet (10 mg total) by mouth 2 (two) times daily. 05/22/15   Massie Maroon, FNP  butalbital-acetaminophen-caffeine (FIORICET) [phone] MG tablet Take 1 tablet by mouth every 6 (six) hours as needed for headache. Patient not taking: Reported on 06/26/2015 05/22/15 05/21/16  Massie Maroon, FNP  cyclobenzaprine (FLEXERIL) 5 MG tablet Take 1 tablet (5 mg total) by mouth 3 (three) times daily as needed for muscle spasms. 05/22/15   Massie Maroon, FNP  diazepam (VALIUM) 5 MG tablet Take 1 tablet (5 mg total) by mouth every 8 (eight) hours as needed for anxiety. 05/01/15   Roxy Horseman, PA-C  topiramate (TOPAMAX) 25 MG tablet Take 1 tablet (25 mg total) by mouth 2 (two) times daily. Patient not taking: Reported on 06/26/2015 05/22/15   Massie Maroon, FNP  valACYclovir (VALTREX) 1000 MG tablet Take 1 tablet (1,000 mg total) by mouth daily. Patient not taking: Reported on 06/26/2015 01/05/15   Massie Maroon, FNP   BP 137/97 mmHg  Pulse 66  Temp(Src) 99.1 F (37.3 C) (Oral)  Resp 22  Ht  (1.702 m)  Wt 211 lb  (95.709 kg)  BMI 33.04 kg/m2  SpO2 100%  LMP 07/22/2015  Physical Exam  Constitutional: She appears well-developed and well-nourished. No distress.  HENT:  Head: Normocephalic and atraumatic.  Neck: Neck supple.  Cardiovascular: Normal rate and regular rhythm.   Pulmonary/Chest: Effort normal and breath sounds normal. No respiratory distress. She has no wheezes. She has no rales. She exhibits tenderness.  Chest is tender to palpation and palpation reproduces her chest pain  Abdominal: Soft. She exhibits no distension. There is tenderness (diffuse, mild). There is no rebound and no guarding.  Musculoskeletal:  TTP left trapezius and pressure produces pain radiation to her head  Neurological: She is alert.  CN II-XII intact, EOMs intact, no pronator drift, grip strengths equal bilaterally; strength 5/5 in all extremities, sensation intact in all extremities; finger to nose, heel to shin, rapid alternating movements normal; gait is normal.  Skin: She is not diaphoretic.  Nursing note and vitals reviewed.   ED Course  Procedures  DIAGNOSTIC STUDIES:  Oxygen Saturation is 100% on RA, normal by my interpretation.    COORDINATION OF CARE:  9:18 PM Will start on migraine cocktail. Discussed treatment plan with pt at bedside and pt agreed to plan.  Labs Review Labs Reviewed  BASIC METABOLIC PANEL  CBC  I-STAT TROPOININ, ED    Imaging Review Dg Chest 2 View  07/22/2015  CLINICAL DATA:  Acute onset of generalized chest pain and nausea. Initial encounter. EXAM: CHEST  2 VIEW COMPARISON:  Chest radiograph performed 09/19/2014 FINDINGS: The lungs are well-aerated and clear. There is no evidence of focal opacification, pleural effusion or pneumothorax. The heart is normal in size; the mediastinal contour is within normal limits. No acute osseous abnormalities are seen. IMPRESSION: No acute cardiopulmonary process seen. Electronically Signed   By: Roanna Raider M.D.   On: 07/22/2015 20:08    I have personally reviewed and evaluated these images and lab results as part of my medical decision-making.   EKG Interpretation None       ED ECG REPORT   Date: 07/22/2015  Rate: 68  Rhythm: normal sinus rhythm  QRS Axis: normal  Intervals: normal  ST/T Wave abnormalities: nonspecific T wave changes  Conduction Disutrbances:none  Narrative Interpretation: +baseline wandering  Old EKG Reviewed: unchanged   I have personally reviewed the EKG tracing and agree with the computerized printout as noted.   MDM   Final diagnoses:  Migraine without status migrainosus, not intractable, unspecified migraine type    Afebrile, nontoxic patient with typical migraine headache.  Pt has frequent migraines and frequent bad migraines, today with pain that kept her from responding to family member, she remembers this and states that it was due to pain.  Doubt seizure or post-ictal state.   No red flags including head trauma, fevers, meningeal signs, focal neurologic deficits.  Migraine cocktail given with improvement.    Pt also c/o chest pain while in waiting room, this was atypical, reproducible with palpation.  She also  had abdominal pain "with laughing" that began with her period today, this is typical of the first day of her period.  D/C home with PCP, neurology follow up, valium given muscle tightness in neck likely exacerbating symptoms.  Discussed result, findings, treatment, and follow up  with patient.  Pt given return precautions.  Pt verbalizes understanding and agrees with plan.      I personally performed the services described in this documentation, which was scribed in my presence. The recorded information has been reviewed and is accurate.    Trixie Dredgemily Beulah Matusek, PA-C 07/22/15 2222  Raeford RazorStephen Kohut, MD 07/24/15 2201

## 2015-07-22 NOTE — ED Notes (Signed)
In lobby, pt states she started having chest pain when her head started hurting. Pt states central chest, associated with nausea. Updated on wait

## 2015-07-22 NOTE — ED Notes (Signed)
Per EMS. Pt reports migraine pain unrelieved by OTC medications. Hx of migraines. Has not eaten today

## 2015-07-22 NOTE — Discharge Instructions (Signed)
Read the information below.  Use the prescribed medication as directed.  Please discuss all new medications with your pharmacist.  You may return to the Emergency Department at any time for worsening condition or any new symptoms that concern you.     You are having a headache. No specific cause was found today for your headache. It may have been a migraine or other cause of headache. Stress, anxiety, fatigue, and depression are common triggers for headaches. Your headache today does not appear to be life-threatening or require hospitalization, but often the exact cause of headaches is not determined in the emergency department. Therefore, follow-up with your doctor is very important to find out what may have caused your headache, and whether or not you need any further diagnostic testing or treatment. Sometimes headaches can appear benign (not harmful), but then more serious symptoms can develop which should prompt an immediate re-evaluation by your doctor or the emergency department. SEEK MEDICAL ATTENTION IF: You develop possible problems with medications prescribed.  The medications don't resolve your headache, if it recurs , or if you have multiple episodes of vomiting or can't take fluids. You have a change from the usual headache. RETURN IMMEDIATELY IF you develop a sudden, severe headache or confusion, become poorly responsive or faint, develop a fever above 100.68F or problem breathing, have a change in speech, vision, swallowing, or understanding, or develop new weakness, numbness, tingling, incoordination, or have a seizure.   Recurrent Migraine Headache A migraine headache is an intense, throbbing pain on one or both sides of your head. Recurrent migraines keep coming back. A migraine can last for 30 minutes to several hours. CAUSES  The exact cause of a migraine headache is not always known. However, a migraine may be caused when nerves in the brain become irritated and release chemicals  that cause inflammation. This causes pain. Certain things may also trigger migraines, such as:   Alcohol.  Smoking.  Stress.  Menstruation.  Aged cheeses.  Foods or drinks that contain nitrates, glutamate, aspartame, or tyramine.  Lack of sleep.  Chocolate.  Caffeine.  Hunger.  Physical exertion.  Fatigue.  Medicines used to treat chest pain (nitroglycerine), birth control pills, estrogen, and some blood pressure medicines. SYMPTOMS   Pain on one or both sides of your head.  Pulsating or throbbing pain.  Severe pain that prevents daily activities.  Pain that is aggravated by any physical activity.  Nausea, vomiting, or both.  Dizziness.  Pain with exposure to bright lights, loud noises, or activity.  General sensitivity to bright lights, loud noises, or smells. Before you get a migraine, you may get warning signs that a migraine is coming (aura). An aura may include:  Seeing flashing lights.  Seeing bright spots, halos, or zigzag lines.  Having tunnel vision or blurred vision.  Having feelings of numbness or tingling.  Having trouble talking.  Having muscle weakness. DIAGNOSIS  A recurrent migraine headache is often diagnosed based on:  Symptoms.  Physical examination.  A CT scan or MRI of your head. These imaging tests cannot diagnose migraines but can help rule out other causes of headaches.  TREATMENT  Medicines may be given for pain and nausea. Medicines can also be given to help prevent recurrent migraines. HOME CARE INSTRUCTIONS  Only take over-the-counter or prescription medicines for pain or discomfort as directed by your health care provider. The use of long-term narcotics is not recommended.  Lie down in a dark, quiet room when you have a  migraine.  Keep a journal to find out what may trigger your migraine headaches. For example, write down:  What you eat and drink.  How much sleep you get.  Any change to your diet or  medicines.  Limit alcohol consumption.  Quit smoking if you smoke.  Get 7-9 hours of sleep, or as recommended by your health care provider.  Limit stress.  Keep lights dim if bright lights bother you and make your migraines worse. SEEK MEDICAL CARE IF:   You do not get relief from the medicines given to you.  You have a recurrence of pain.  You have a fever. SEEK IMMEDIATE MEDICAL CARE IF:  Your migraine becomes severe.  You have a stiff neck.  You have loss of vision.  You have muscular weakness or loss of muscle control.  You start losing your balance or have trouble walking.  You feel faint or pass out.  You have severe symptoms that are different from your first symptoms. MAKE SURE YOU:   Understand these instructions.  Will watch your condition.  Will get help right away if you are not doing well or get worse.   This information is not intended to replace advice given to you by your health care provider. Make sure you discuss any questions you have with your health care provider.   Document Released: 10/12/2000 Document Revised: 02/07/2014 Document Reviewed: 09/24/2012 Elsevier Interactive Patient Education [employer]

## 2015-08-08 ENCOUNTER — Emergency Department (HOSPITAL_COMMUNITY)
Admission: EM | Admit: 2015-08-08 | Discharge: 2015-08-08 | Disposition: A | Discharge status: Home / Self Care | Payer: Medicaid Other | Attending: Emergency Medicine | Admitting: Emergency Medicine

## 2015-08-08 ENCOUNTER — Encounter (HOSPITAL_COMMUNITY): Payer: Self-pay | Admitting: Emergency Medicine

## 2015-08-08 DIAGNOSIS — Z79899 Other long term (current) drug therapy: Secondary | ICD-10-CM | POA: Insufficient documentation

## 2015-08-08 DIAGNOSIS — I10 Essential (primary) hypertension: Secondary | ICD-10-CM | POA: Insufficient documentation

## 2015-08-08 DIAGNOSIS — Z7982 Long term (current) use of aspirin: Secondary | ICD-10-CM | POA: Insufficient documentation

## 2015-08-08 DIAGNOSIS — G43709 Chronic migraine without aura, not intractable, without status migrainosus: Secondary | ICD-10-CM | POA: Insufficient documentation

## 2015-08-08 MED ORDER — METOCLOPRAMIDE HCL 5 MG/ML IJ SOLN
10.0000 mg | Freq: Once | INTRAMUSCULAR | Status: AC
  Administered 2015-08-08: 10 mg via INTRAVENOUS
  Filled 2015-08-08: qty 2

## 2015-08-08 MED ORDER — SODIUM CHLORIDE 0.9 % IV BOLUS (SEPSIS)
1000.0000 mL | Freq: Once | INTRAVENOUS | Status: AC
  Administered 2015-08-08: 1000 mL via INTRAVENOUS

## 2015-08-08 MED ORDER — DIPHENHYDRAMINE HCL 50 MG/ML IJ SOLN
25.0000 mg | Freq: Once | INTRAMUSCULAR | Status: AC
  Administered 2015-08-08: 25 mg via INTRAVENOUS
  Filled 2015-08-08: qty 1

## 2015-08-08 MED ORDER — DEXAMETHASONE SODIUM PHOSPHATE 10 MG/ML IJ SOLN
10.0000 mg | Freq: Once | INTRAMUSCULAR | Status: AC
  Administered 2015-08-08: 10 mg via INTRAVENOUS
  Filled 2015-08-08: qty 1

## 2015-08-08 NOTE — Discharge Instructions (Signed)
Recurrent Migraine Headache A migraine headache is an intense, throbbing pain on one or both sides of your head. Recurrent migraines keep coming back. A migraine can last for 30 minutes to several hours. CAUSES  The exact cause of a migraine headache is not always known. However, a migraine may be caused when nerves in the brain become irritated and release chemicals that cause inflammation. This causes pain. Certain things may also trigger migraines, such as:   Alcohol.  Smoking.  Stress.  Menstruation.  Aged cheeses.  Foods or drinks that contain nitrates, glutamate, aspartame, or tyramine.  Lack of sleep.  Chocolate.  Caffeine.  Hunger.  Physical exertion.  Fatigue.  Medicines used to treat chest pain (nitroglycerine), birth control pills, estrogen, and some blood pressure medicines. SYMPTOMS   Pain on one or both sides of your head.  Pulsating or throbbing pain.  Severe pain that prevents daily activities.  Pain that is aggravated by any physical activity.  Nausea, vomiting, or both.  Dizziness.  Pain with exposure to bright lights, loud noises, or activity.  General sensitivity to bright lights, loud noises, or smells. Before you get a migraine, you may get warning signs that a migraine is coming (aura). An aura may include:  Seeing flashing lights.  Seeing bright spots, halos, or zigzag lines.  Having tunnel vision or blurred vision.  Having feelings of numbness or tingling.  Having trouble talking.  Having muscle weakness. DIAGNOSIS  A recurrent migraine headache is often diagnosed based on:  Symptoms.  Physical examination.  A CT scan or MRI of your head. These imaging tests cannot diagnose migraines but can help rule out other causes of headaches.  TREATMENT  Medicines may be given for pain and nausea. Medicines can also be given to help prevent recurrent migraines. HOME CARE INSTRUCTIONS  Only take over-the-counter or prescription  medicines for pain or discomfort as directed by your health care provider. The use of long-term narcotics is not recommended.  Lie down in a dark, quiet room when you have a migraine.  Keep a journal to find out what may trigger your migraine headaches. For example, write down:  What you eat and drink.  How much sleep you get.  Any change to your diet or medicines.  Limit alcohol consumption.  Quit smoking if you smoke.  Get 7-9 hours of sleep, or as recommended by your health care provider.  Limit stress.  Keep lights dim if bright lights bother you and make your migraines worse. SEEK MEDICAL CARE IF:   You do not get relief from the medicines given to you.  You have a recurrence of pain.  You have a fever. SEEK IMMEDIATE MEDICAL CARE IF:  Your migraine becomes severe.  You have a stiff neck.  You have loss of vision.  You have muscular weakness or loss of muscle control.  You start losing your balance or have trouble walking.  You feel faint or pass out.  You have severe symptoms that are different from your first symptoms. MAKE SURE YOU:   Understand these instructions.  Will watch your condition.  Will get help right away if you are not doing well or get worse.   This information is not intended to replace advice given to you by your health care provider. Make sure you discuss any questions you have with your health care provider.   Document Released: 10/12/2000 Document Revised: 02/07/2014 Document Reviewed: 09/24/2012 Elsevier Interactive Patient Education 2016 Elsevier Inc.  

## 2015-08-08 NOTE — ED Provider Notes (Signed)
CSN: [id number]     Arrival date & time 08/08/15  0800 History   First MD Initiated Contact with Patient 08/08/15 [id number]     Chief Complaint  Patient presents with  . Migraine     (Consider location/radiation/quality/duration/timing/severity/associated sxs/prior Treatment) HPI   35 year old female with history of migraine presenting to ED with complaints of headache. Patient reports gradual onset of left-sided headache described as a pounding sensation, severe in intensity which started yesterday and has been persistent throughout the day today. She endorse nausea and has vomited several times this morning. Also endorsed light and sound sensitivity. Patient also complaining of tingling sensation to both legs" I cannot feel my legs". Her headache is similar to prior migraine headache that she has on a regular basis. Patient states she has at least 4 headaches per week. She is scheduled to be seen by a neurologist next week. She has been evaluated for her headache in the past with head CT scan that was unremarkable. Patient keeps a headache diary but unable to identify the causative factor. She denies any other and change in environment, change in medication or anything else that can contribute to her headache. She did take 4 Excedrin Migraine headache yesterday with minimal improvement. No other complaint, specifically no fever, neck stiffness, or URI symptoms.   Past Medical History  Diagnosis Date  . Hypertension   . Herpes genitalia   . Migraines    Past Surgical History  Procedure Laterality Date  . Tubal ligation     Family History  Problem Relation Age of Onset  . Diabetes Mother    Social History  Substance Use Topics  . Smoking status: Never Smoker   . Smokeless tobacco: None  . Alcohol Use: No   OB History    No data available     Review of Systems  All other systems reviewed and are negative.     Allergies  Review of patient's allergies indicates no known  allergies.  Home Medications   Prior to Admission medications   Medication Sig Start Date End Date Taking? Authorizing Provider  aspirin-acetaminophen-caffeine (EXCEDRIN MIGRAINE) [phone] MG tablet Take 3 tablets by mouth every 4 (four) hours as needed for headache.   Yes Historical Provider, MD  busPIRone (BUSPAR) 10 MG tablet Take 1 tablet (10 mg total) by mouth 2 (two) times daily. 05/22/15  Yes Massie Maroon, FNP  butalbital-acetaminophen-caffeine (FIORICET) [phone] MG tablet Take 1 tablet by mouth every 6 (six) hours as needed for headache. Patient not taking: Reported on 06/26/2015 05/22/15 05/21/16  Massie Maroon, FNP  cyclobenzaprine (FLEXERIL) 5 MG tablet Take 1 tablet (5 mg total) by mouth 3 (three) times daily as needed for muscle spasms. Patient not taking: Reported on 08/08/2015 05/22/15   Massie Maroon, FNP  diazepam (VALIUM) 5 MG tablet Take 1 tablet (5 mg total) by mouth every 8 (eight) hours as needed for muscle spasms (and pain). Patient not taking: Reported on 08/08/2015 07/22/15   Trixie Dredge, PA-C  topiramate (TOPAMAX) 25 MG tablet Take 1 tablet (25 mg total) by mouth 2 (two) times daily. Patient not taking: Reported on 06/26/2015 05/22/15   Massie Maroon, FNP  valACYclovir (VALTREX) 1000 MG tablet Take 1 tablet (1,000 mg total) by mouth daily. Patient not taking: Reported on 06/26/2015 01/05/15   Massie Maroon, FNP   BP 140/89 mmHg  Pulse 95  Temp(Src) 98.3 F (36.8 C) (Oral)  Resp 18  SpO2 99%  LMP 07/22/2015 Physical  Exam  Constitutional: She is oriented to person, place, and time. She appears well-developed and well-nourished. No distress.  HENT:  Head: Atraumatic.  Mouth/Throat: Oropharynx is clear and moist.  Eyes: Conjunctivae and EOM are normal. Pupils are equal, round, and reactive to light.  Neck: Normal range of motion. Neck supple.  No nuchal rigidity  Cardiovascular: Normal rate and regular rhythm.   Pulmonary/Chest: Effort normal and breath  sounds normal.  Abdominal: Soft.  Neurological: She is alert and oriented to person, place, and time. She has normal strength. No cranial nerve deficit or sensory deficit. GCS eye subscore is 4. GCS verbal subscore is 5. GCS motor subscore is 6.  5/5 strength to all 4 extremities  Skin: No rash noted.  Psychiatric: She has a normal mood and affect.  Nursing note and vitals reviewed.   ED Course  Procedures (including critical care time)   MDM   Final diagnoses:  Chronic migraine without aura without status migrainosus, not intractable    BP 129/87 mmHg  Pulse 83  Temp(Src) 98.3 F (36.8 C) (Oral)  Resp 17  SpO2 99%  LMP 07/22/2015   Headache similar to previous, no fever, neck stiffness, neuro findings or new symptoms to suggest more serious etiology.  I don't think SAH, ICH, meningitis, encephalitis, mass at this time.  No recent trauma.  I don't feel imaging necessary at this time.  Plan to control symptoms.  12:05 PM Pt report improvement of sxs.  Pt stable for discharge.  Return precaution discussed.  Recommend f/u with neurology for further care.     Fayrene HelperBowie Karizma Cheek, PA-C 08/08/15 1206  Gerhard Munchobert Lockwood, MD 08/08/15 1515

## 2015-08-08 NOTE — ED Notes (Signed)
Pt family member came out of the room and reports that pt "cannot feel her legs." Greta DoomBowie, PA notified of pt complaint

## 2015-08-08 NOTE — ED Notes (Signed)
Pt reports migraine with associated symptoms of emesis and light sensitivity.

## 2015-08-08 NOTE — ED Notes (Signed)
Pt resting with eyes closed, visitor at beside, Pain now 9/10, which she admits is better.

## 2015-08-12 ENCOUNTER — Ambulatory Visit (INDEPENDENT_AMBULATORY_CARE_PROVIDER_SITE_OTHER): Payer: Medicaid Other | Admitting: Neurology

## 2015-08-12 ENCOUNTER — Encounter: Payer: Self-pay | Admitting: Neurology

## 2015-08-12 VITALS — BP 104/60 | HR 91 | Ht 67.0 in | Wt 225.0 lb

## 2015-08-12 DIAGNOSIS — F418 Other specified anxiety disorders: Secondary | ICD-10-CM

## 2015-08-12 DIAGNOSIS — G43709 Chronic migraine without aura, not intractable, without status migrainosus: Secondary | ICD-10-CM

## 2015-08-12 DIAGNOSIS — F329 Major depressive disorder, single episode, unspecified: Secondary | ICD-10-CM

## 2015-08-12 DIAGNOSIS — F419 Anxiety disorder, unspecified: Secondary | ICD-10-CM

## 2015-08-12 MED ORDER — VENLAFAXINE HCL ER 37.5 MG PO CP24: ORAL_CAPSULE | ORAL | Status: DC

## 2015-08-12 MED ORDER — ONDANSETRON 8 MG PO TBDP: 8.0000 mg | ORAL_TABLET | Freq: Three times a day (TID) | ORAL | Status: DC | PRN

## 2015-08-12 MED ORDER — SUMATRIPTAN 20 MG/ACT NA SOLN: NASAL | Status: DC

## 2015-08-12 NOTE — Progress Notes (Signed)
Chart forwarded.  
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
## 2015-08-12 NOTE — Patient Instructions (Signed)
Migraine Recommendations: 1.  Start venlafaxine XR 37.5mg .  Take 1 capsule daily for 7 days, then increase to 2 capsules daily.  Call in 4 weeks with update and we can adjust dose if needed. 2.  Stop Excedrin.  Take sumatriptan 20mg  NSS at earliest onset of headache.  1 spray in one nostril.  May repeat dose once in 2 hours if needed.  Do not exceed two doses in 24 hours.  May use ondoansetron (Zofran ODT) 8mg  every 8 hours as needed for nausea. 3.  Limit use of pain relievers to no more than 2 days out of the week.  These medications include acetaminophen, ibuprofen, triptans and narcotics.  This will help reduce risk of rebound headaches. 4.  Be aware of common food triggers such as processed sweets, processed foods with nitrites (such as deli meat, hot dogs, sausages), foods with MSG, alcohol (such as wine), chocolate, certain cheeses, certain fruits (dried fruits, some citrus fruit), vinegar, diet soda. 4.  Avoid caffeine 5.  Routine exercise 6.  Proper sleep hygiene 7.  Stay adequately hydrated with water 8.  Keep a headache diary. 9.  Maintain proper stress management. 10.  Do not skip meals. 11.  Consider supplements:  Magnesium oxide 400mg  to 600mg  daily, riboflavin 400mg , Coenzyme Q 10 100mg  three times daily 12.  Follow up in 3 months but contact us in 4 weeks with update.  Migraine Headache A migraine headache is an intense, throbbing pain on one or both sides of your head. A migraine can last for 30 minutes to several hours. CAUSES  The exact cause of a migraine headache is not always known. However, a migraine may be caused when nerves in the brain become irritated and release chemicals that cause inflammation. This causes pain. Certain things may also trigger migraines, such as:  Alcohol.  Smoking.  Stress.  Menstruation.  Aged cheeses.  Foods or drinks that contain nitrates, glutamate, aspartame, or tyramine.  Lack of  sleep.  Chocolate.  Caffeine.  Hunger.  Physical exertion.  Fatigue.  Medicines used to treat chest pain (nitroglycerine), birth control pills, estrogen, and some blood pressure medicines. SIGNS AND SYMPTOMS  Pain on one or both sides of your head.  Pulsating or throbbing pain.  Severe pain that prevents daily activities.  Pain that is aggravated by any physical activity.  Nausea, vomiting, or both.  Dizziness.  Pain with exposure to bright lights, loud noises, or activity.  General sensitivity to bright lights, loud noises, or smells. Before you get a migraine, you may get warning signs that a migraine is coming (aura). An aura may include:  Seeing flashing lights.  Seeing bright spots, halos, or zigzag lines.  Having tunnel vision or blurred vision.  Having feelings of numbness or tingling.  Having trouble talking.  Having muscle weakness. DIAGNOSIS  A migraine headache is often diagnosed based on:  Symptoms.  Physical exam.  A CT scan or MRI of your head. These imaging tests cannot diagnose migraines, but they can help rule out other causes of headaches. TREATMENT Medicines may be given for pain and nausea. Medicines can also be given to help prevent recurrent migraines.  HOME CARE INSTRUCTIONS  Only take over-the-counter or prescription medicines for pain or discomfort as directed by your health care provider. The use of long-term narcotics is not recommended.  Lie down in a dark, quiet room when you have a migraine.  Keep a journal to find out what may trigger your migraine headaches. For example,  write down:  What you eat and drink.  How much sleep you get.  Any change to your diet or medicines.  Limit alcohol consumption.  Quit smoking if you smoke.  Get 7-9 hours of sleep, or as recommended by your health care provider.  Limit stress.  Keep lights dim if bright lights bother you and make your migraines worse. SEEK IMMEDIATE MEDICAL  CARE IF:   Your migraine becomes severe.  You have a fever.  You have a stiff neck.  You have vision loss.  You have muscular weakness or loss of muscle control.  You start losing your balance or have trouble walking.  You feel faint or pass out.  You have severe symptoms that are different from your first symptoms. MAKE SURE YOU:   Understand these instructions.  Will watch your condition.  Will get help right away if you are not doing well or get worse.   This information is not intended to replace advice given to you by your health care provider. Make sure you discuss any questions you have with your health care provider.   Document Released: 01/17/2005 Document Revised: 02/07/2014 Document Reviewed: 09/24/2012 Elsevier Interactive Patient Education [employer]

## 2015-08-21 ENCOUNTER — Encounter: Payer: Self-pay | Admitting: Family Medicine

## 2015-08-21 ENCOUNTER — Ambulatory Visit (INDEPENDENT_AMBULATORY_CARE_PROVIDER_SITE_OTHER): Payer: Self-pay | Admitting: Family Medicine

## 2015-08-21 VITALS — BP 134/92 | HR 90 | Temp 97.8°F | Resp 16 | Ht 67.0 in | Wt 229.0 lb

## 2015-08-21 DIAGNOSIS — R51 Headache: Secondary | ICD-10-CM

## 2015-08-21 DIAGNOSIS — F32A Depression, unspecified: Secondary | ICD-10-CM

## 2015-08-21 DIAGNOSIS — F329 Major depressive disorder, single episode, unspecified: Secondary | ICD-10-CM

## 2015-08-21 DIAGNOSIS — G8929 Other chronic pain: Secondary | ICD-10-CM

## 2015-08-21 MED ORDER — ONDANSETRON 8 MG PO TBDP: 8.0000 mg | ORAL_TABLET | Freq: Three times a day (TID) | ORAL | Status: DC | PRN

## 2015-08-21 MED ORDER — SUMATRIPTAN 20 MG/ACT NA SOLN: NASAL | Status: DC

## 2015-08-21 NOTE — Patient Instructions (Signed)
[facility] is at [address].  They will have the Imitrex nasal spray on Monday (call before going) I have also prescribed the zofran there. Follow-up with Dr. Everlena CooperJaffe and [facility] as planned.

## 2015-08-21 NOTE — Progress Notes (Signed)
Patient ID: Gina [name], female   DOB: [birth year], 35 y.o.   MRN: [id number]   Gina [name], is a 35 y.o. female  PPI:[id number]  AYT:[id number]  DOB - 1980/08/23  CC:  Chief Complaint  Patient presents with  . Follow-up    headaches        HPI: Gina [name] is a 35 y.o. female here to follow-up on headaches and anxiety per last notes of Mrs. Hollis. Since that time she has been seen by Dr. Adriana Mccallum (neuro) and has been prescribed sumatriptin, Zofran and Effexor (dosages are in medication list.) She has not fill her prescription for sumatriptin or Zofran due to lack of funds. She is followed for depression at [facility] and was able to get her Effexor there. She reports no significant change in condition since last visit. At that visit she was prescribed Buspar and has not found that helpful. She is up to date on health maintenance except for needing a PAP.   No Known Allergies Past Medical History  Diagnosis Date  . Hypertension   . Herpes genitalia   . Migraines    Current Outpatient Prescriptions on File Prior to Visit  Medication Sig Dispense Refill  . venlafaxine XR (EFFEXOR-XR) 37.5 MG 24 hr capsule Take 1 cap daily for 7 days, then 2 caps daily 60 capsule 0  . topiramate (TOPAMAX) 25 MG tablet Take 1 tablet (25 mg total) by mouth 2 (two) times daily. (Patient not taking: Reported on 06/26/2015) 60 tablet 1  . valACYclovir (VALTREX) 1000 MG tablet Take 1 tablet (1,000 mg total) by mouth daily. (Patient not taking: Reported on 06/26/2015) 30 tablet 5   No current facility-administered medications on file prior to visit.   Family History  Problem Relation Age of Onset  . Diabetes Mother    Social History   Social History  . Marital Status: Single    Spouse Name: N/A  . Number of Children: N/A  . Years of Education: N/A   Occupational History  . Not on file.   Social History Main Topics  . Smoking status: Never Smoker   . Smokeless tobacco: Not on  file  . Alcohol Use: No  . Drug Use: No  . Sexual Activity: Yes    Birth Control/ Protection: Condom   Other Topics Concern  . Not on file   Social History Narrative    Review of Systems: Constitutional: Negative for fever, chills, appetite change, weight loss,  Fatigue. Skin: Negative for rashes or lesions of concern. HENT: Negative for ear pain, ear discharge.nose bleeds Eyes: Negative for pain, discharge, redness, itching. Blurry vision with headaches Neck: Negative for pain, stiffness Respiratory: Negative for cough, shortness of breath,   Cardiovascular: Negative palpitations and leg swelling.Reports chest pain that seems to be related to stress and being upset Gastrointestinal: Negative for abdominal pain, nausea, diarrhea, constipations. Positive for vomiting with headaches Genitourinary: Negative for dysuria, urgency, frequency, hematuria,  Musculoskeletal: Negative for back pain, joint pain, joint  swelling, and gait problem.Negative for weakness. Neurological: Negative for dizziness, tremors, seizures, syncope,   light-headedness, numbness. Positive for frequent headaches Hematological: Negative for easy bruising or bleeding Psychiatric/Behavioral: Positive for depression/anxiety   Objective:   Filed Vitals:   08/21/15 1022  BP: 134/92  Pulse: 90  Temp: 97.8 F (36.6 C)  Resp: 16    Physical Exam: Constitutional: Patient appears well-developed and well-nourished. No distress. HENT: Normocephalic, atraumatic, External right and left ear normal. Oropharynx is clear and moist.  Eyes: Conjunctivae and EOM are normal. PERRLA, no scleral icterus. Neck: Normal ROM. Neck supple. No lymphadenopathy, No thyromegaly. CVS: RRR, S1/S2 +, no murmurs, no gallops, no rubs Pulmonary: Effort and breath sounds normal, no stridor, rhonchi, wheezes, rales.  Abdominal: Soft. Normoactive BS,, no distension, tenderness, rebound or guarding.  Musculoskeletal: Normal range of motion.  No edema and no tenderness.  Neuro: Alert.Normal muscle tone coordination. Non-focal Skin: Skin is warm and dry. No rash noted. Not diaphoretic. No erythema. No pallor. Psychiatric: Affect flat. Behavior, judgment, thought content normal.  Lab Results  Component Value Date   WBC 6.4 07/22/2015   HGB 14.1 07/22/2015   HCT 40.7 07/22/2015   MCV 87.3 07/22/2015   PLT 290 07/22/2015   Lab Results  Component Value Date   CREATININE 0.83 07/22/2015   BUN 12 07/22/2015   NA 136 07/22/2015   K 4.3 07/22/2015   CL 104 07/22/2015   CO2 23 07/22/2015    Lab Results  Component Value Date   HGBA1C 5.6 01/05/2015   Lipid Panel     Component Value Date/Time   CHOL 250* 01/05/2015 1207   TRIG 129 01/05/2015 1207   HDL 46 01/05/2015 1207   CHOLHDL 5.4* 01/05/2015 1207   VLDL 26 01/05/2015 1207   LDLCALC 178* 01/05/2015 1207       Assessment and plan:   1. Chronic nonintractable headache, unspecified headache type  - ondansetron (ZOFRAN ODT) 8 MG disintegrating tablet; Take 1 tablet (8 mg total) by mouth every 8 (eight) hours as needed for nausea or vomiting.  Dispense: 20 tablet; Refill: 0 - SUMAtriptan (IMITREX) 20 MG/ACT nasal spray; 1 spray in one nostril.  May repeat once in 2 hours if headache persists or recurs.  Dispense: 1 Inhaler; Refill: 2  2. Depression -[facility] -Continue Effexor prescribed by Dr. Adriana MccallumJaffee   Return in about 6 months (around 02/21/2016).  The patient was given clear instructions to go to ER or return to medical center if symptoms don't improve, worsen or new problems develop. The patient verbalized understanding.    Henrietta HooverLinda C Sender Rueb FNP  08/21/2015, 11:13 AM

## 2015-08-24 MED FILL — ONDANSETRON ODT 8 MG TABLET: 8 | 6 days supply | Qty: 20 | Fill #0

## 2015-08-24 MED FILL — SUMAtriptan 20 MG/ACT SOLN: 20 | 20 days supply | Qty: 6 | Fill #0 | Status: TO

## 2015-10-28 ENCOUNTER — Encounter (HOSPITAL_COMMUNITY): Payer: Self-pay | Admitting: Emergency Medicine

## 2015-10-28 DIAGNOSIS — N390 Urinary tract infection, site not specified: Secondary | ICD-10-CM | POA: Insufficient documentation

## 2015-10-28 DIAGNOSIS — K760 Fatty (change of) liver, not elsewhere classified: Secondary | ICD-10-CM | POA: Insufficient documentation

## 2015-10-28 DIAGNOSIS — I10 Essential (primary) hypertension: Secondary | ICD-10-CM | POA: Insufficient documentation

## 2015-10-28 LAB — CBC WITH DIFFERENTIAL/PLATELET
BASOS ABS: 0 10*3/uL (ref 0.0–0.1)
Basophils Relative: 1 %
EOS PCT: 2 %
Eosinophils Absolute: 0.1 10*3/uL (ref 0.0–0.7)
HCT: 37.5 % (ref 36.0–46.0)
Hemoglobin: 13.1 g/dL (ref 12.0–15.0)
LYMPHS PCT: 47 %
Lymphs Abs: 2.5 10*3/uL (ref 0.7–4.0)
MCH: 31.4 pg (ref 26.0–34.0)
MCHC: 34.9 g/dL (ref 30.0–36.0)
MCV: 89.9 fL (ref 78.0–100.0)
Monocytes Absolute: 0.5 10*3/uL (ref 0.1–1.0)
Monocytes Relative: 10 %
Neutro Abs: 2.1 10*3/uL (ref 1.7–7.7)
Neutrophils Relative %: 40 %
PLATELETS: 258 10*3/uL (ref 150–400)
RBC: 4.17 MIL/uL (ref 3.87–5.11)
RDW: 12.7 % (ref 11.5–15.5)
WBC: 5.3 10*3/uL (ref 4.0–10.5)

## 2015-10-28 LAB — URINALYSIS, ROUTINE W REFLEX MICROSCOPIC
Bilirubin Urine: NEGATIVE
Glucose, UA: NEGATIVE mg/dL
Hgb urine dipstick: NEGATIVE
KETONES UR: NEGATIVE mg/dL
NITRITE: NEGATIVE
PROTEIN: NEGATIVE mg/dL
Specific Gravity, Urine: 1.034 — ABNORMAL HIGH (ref 1.005–1.030)
pH: 5.5 (ref 5.0–8.0)

## 2015-10-28 LAB — BASIC METABOLIC PANEL
ANION GAP: 8 (ref 5–15)
BUN: 11 mg/dL (ref 6–20)
CO2: 24 mmol/L (ref 22–32)
Calcium: 9.5 mg/dL (ref 8.9–10.3)
Chloride: 106 mmol/L (ref 101–111)
Creatinine, Ser: 0.96 mg/dL (ref 0.44–1.00)
GLUCOSE: 84 mg/dL (ref 65–99)
POTASSIUM: 3.7 mmol/L (ref 3.5–5.1)
SODIUM: 138 mmol/L (ref 135–145)

## 2015-10-28 LAB — URINE MICROSCOPIC-ADD ON: RBC / HPF: NONE SEEN RBC/hpf (ref 0–5)

## 2015-10-28 NOTE — ED Triage Notes (Signed)
Pt. reports right lower flank pain with dysuria onset last week , no hematuria or fever .

## 2015-10-29 ENCOUNTER — Emergency Department (HOSPITAL_COMMUNITY)
Admission: EM | Admit: 2015-10-29 | Discharge: 2015-10-29 | Disposition: A | Discharge status: Home / Self Care | Payer: Medicaid Other | Attending: Emergency Medicine | Admitting: Emergency Medicine

## 2015-10-29 ENCOUNTER — Emergency Department (HOSPITAL_COMMUNITY): Payer: Medicaid Other

## 2015-10-29 DIAGNOSIS — K760 Fatty (change of) liver, not elsewhere classified: Secondary | ICD-10-CM

## 2015-10-29 DIAGNOSIS — R109 Unspecified abdominal pain: Secondary | ICD-10-CM

## 2015-10-29 DIAGNOSIS — N390 Urinary tract infection, site not specified: Secondary | ICD-10-CM

## 2015-10-29 LAB — HEPATIC FUNCTION PANEL
ALBUMIN: 3.7 g/dL (ref 3.5–5.0)
ALK PHOS: 39 U/L (ref 38–126)
ALT: 58 U/L — ABNORMAL HIGH (ref 14–54)
AST: 55 U/L — ABNORMAL HIGH (ref 15–41)
Bilirubin, Direct: <0.1 mg/dL — ABNORMAL LOW (ref 0.1–0.5)
TOTAL PROTEIN: 6.6 g/dL (ref 6.5–8.1)
Total Bilirubin: 0.4 mg/dL (ref 0.3–1.2)

## 2015-10-29 LAB — PREGNANCY, URINE: Preg Test, Ur: NEGATIVE

## 2015-10-29 MED ORDER — TRAMADOL HCL 50 MG PO TABS: 50.0000 mg | ORAL_TABLET | Freq: Two times a day (BID) | ORAL | 0 refills | Status: DC | PRN

## 2015-10-29 MED ORDER — HYDROMORPHONE HCL 1 MG/ML IJ SOLN
1.0000 mg | Freq: Once | INTRAMUSCULAR | Status: AC
  Administered 2015-10-29: 1 mg via INTRAVENOUS
  Filled 2015-10-29: qty 1

## 2015-10-29 MED ORDER — CEPHALEXIN 500 MG PO CAPS: 500.0000 mg | ORAL_CAPSULE | Freq: Two times a day (BID) | ORAL | 0 refills | Status: AC

## 2015-10-29 MED ORDER — CEFTRIAXONE SODIUM 1 G IJ SOLR
1.0000 g | Freq: Once | INTRAMUSCULAR | Status: AC
  Administered 2015-10-29: 1 g via INTRAVENOUS
  Filled 2015-10-29: qty 10

## 2015-10-29 MED ORDER — SODIUM CHLORIDE 0.9 % IV BOLUS (SEPSIS)
1000.0000 mL | Freq: Once | INTRAVENOUS | Status: AC
  Administered 2015-10-29: 1000 mL via INTRAVENOUS

## 2015-10-29 MED ORDER — FENTANYL CITRATE (PF) 100 MCG/2ML IJ SOLN
50.0000 ug | Freq: Once | INTRAMUSCULAR | Status: AC
  Administered 2015-10-29: 50 ug via INTRAVENOUS
  Filled 2015-10-29: qty 2

## 2015-10-29 MED ORDER — ONDANSETRON HCL 4 MG/2ML IJ SOLN
4.0000 mg | Freq: Once | INTRAMUSCULAR | Status: AC
  Administered 2015-10-29: 4 mg via INTRAVENOUS
  Filled 2015-10-29: qty 2

## 2015-10-29 MED ORDER — KETOROLAC TROMETHAMINE 30 MG/ML IJ SOLN
30.0000 mg | Freq: Once | INTRAMUSCULAR | Status: AC
  Administered 2015-10-29: 30 mg via INTRAVENOUS
  Filled 2015-10-29: qty 1

## 2015-10-29 MED FILL — CEPHALEXIN 500 MG CAPSULE: 500 | 7 days supply | Qty: 14 | Fill #0

## 2015-10-29 MED FILL — traMADol HCL 50 MG TABS: 50 | 5 days supply | Qty: 10 | Fill #0

## 2015-10-29 NOTE — ED Notes (Signed)
Call placed to lab again for pending pregnancy test. Per lab to result in 4 minutes.

## 2015-10-29 NOTE — Discharge Instructions (Signed)
Read the information below.  You are being treated for a urinary tract infection. You are being prescribed antibiotics. Please take as directed. Be sure to stay hydrated. You can take motrin 400mg  every 6hrs fo mild to moderate pain. I have prescribed tramadol for severe pain.  Your liver showed some changes on imaging and your liver enzymes are just outside of normal. The hepatic panel will not result today, you will be notified of abnormal results. It is very important that you follow up with a stomach doctor. I have provided the contact information above, please call ASAP.  Use the prescribed medication as directed.  Please discuss all new medications with your pharmacist.   Please follow up with your primary doctor in a week for re-evaluation.  You may return to the Emergency Department at any time for worsening condition or any new symptoms that concern you. Return to ED if you develop fever, worsening abdominal pain, unable to keep food/fluids down.

## 2015-10-29 NOTE — ED Provider Notes (Signed)
[facility]-EMERGENCY DEPT Provider Note   CSN: [id number] Arrival date & time: 10/28/15  2244   History   Chief Complaint Chief Complaint  Patient presents with  . Flank Pain  . Dysuria   HPI   Gina [name] is an 35 y.o. female presenting to the ED for evaluation of right flank pain and dysuria. Reports dysuria started one week ago. Denies hematuria. States over the last few days has developed right flank pain that radiates to her RLQ and across her lower back. She states the pain is severe. It is worse with movement and palpation. Denies nausea or vomiting. Denies fever or chills. Denies dysuria. States she has never had a kidney stone before. Does not frequently get UTIs. She has not tried anything for her symptoms. Denies vaginal discharge or bleeding.  Past Medical History:  Diagnosis Date  . Herpes genitalia   . Hypertension   . Migraines     Patient Active Problem List   Diagnosis Date Noted  . Migraine headache 04/15/2015  . Generalized anxiety disorder 02/06/2015  . Cephalalgia 01/05/2015  . Genital herpes 01/05/2015  . Seasonal allergies 01/05/2015  . Obesity 01/05/2015  . History of hypertension 01/05/2015    Past Surgical History:  Procedure Laterality Date  . TUBAL LIGATION      OB History    No data available       Home Medications    Prior to Admission medications   Medication Sig Start Date End Date Taking? Authorizing Provider  ondansetron (ZOFRAN ODT) 8 MG disintegrating tablet Take 1 tablet (8 mg total) by mouth every 8 (eight) hours as needed for nausea or vomiting. 08/21/15   Henrietta Hoover, NP  SUMAtriptan (IMITREX) 20 MG/ACT nasal spray 1 spray in one nostril.  May repeat once in 2 hours if headache persists or recurs. 08/21/15   Henrietta Hoover, NP  topiramate (TOPAMAX) 25 MG tablet Take 1 tablet (25 mg total) by mouth 2 (two) times daily. Patient not taking: Reported on 06/26/2015 05/22/15   Massie Maroon, FNP  valACYclovir (VALTREX) 1000  MG tablet Take 1 tablet (1,000 mg total) by mouth daily. Patient not taking: Reported on 06/26/2015 01/05/15   Massie Maroon, FNP  venlafaxine XR (EFFEXOR-XR) 37.5 MG 24 hr capsule Take 1 cap daily for 7 days, then 2 caps daily 08/12/15   Drema Dallas, DO    Family History Family History  Problem Relation Age of Onset  . Diabetes Mother     Social History Social History  Substance Use Topics  . Smoking status: Never Smoker  . Smokeless tobacco: Never Used  . Alcohol use No     Allergies   Review of patient's allergies indicates no known allergies.   Review of Systems Review of Systems 10 Systems reviewed and are negative for acute change except as noted in the HPI.  Physical Exam Updated Vital Signs BP 131/87   Pulse 87   Temp 98.3 F (36.8 C) (Oral)   Resp 16   LMP 10/21/2015 (Approximate)   SpO2 100%   Physical Exam  Constitutional: She is oriented to person, place, and time.  Appears uncomfortable  HENT:  Right Ear: External ear normal.  Left Ear: External ear normal.  Nose: Nose normal.  Mouth/Throat: Oropharynx is clear and moist. No oropharyngeal exudate.  Eyes: Conjunctivae are normal.  Neck: Neck supple.  Cardiovascular: Normal rate, regular rhythm, normal heart sounds and intact distal pulses.   Pulmonary/Chest: Effort normal and  breath sounds normal. No respiratory distress. She has no wheezes.  Abdominal: Soft. Bowel sounds are normal. She exhibits no distension. There is no rebound and no guarding.  Marked right CVA and diffuse right abdominal tenderness with guarding. Some suprapubic tenderness. Abdomen obese, non distended, soft.   Musculoskeletal: She exhibits no edema.  Lymphadenopathy:    She has no cervical adenopathy.  Neurological: She is alert and oriented to person, place, and time. No cranial nerve deficit.  Skin: Skin is warm and dry.  Psychiatric: She has a normal mood and affect.  Nursing note and vitals reviewed.    ED  Treatments / Results  Labs (all labs ordered are listed, but only abnormal results are displayed) Labs Reviewed  URINALYSIS, ROUTINE W REFLEX MICROSCOPIC (NOT AT [facility]) - Abnormal; Notable for the following:       Result Value   APPearance CLOUDY (*)    Specific Gravity, Urine 1.034 (*)    Leukocytes, UA SMALL (*)    All other components within normal limits  URINE MICROSCOPIC-ADD ON - Abnormal; Notable for the following:    Squamous Epithelial / LPF 6-30 (*)    Bacteria, UA MANY (*)    All other components within normal limits  URINE CULTURE  CBC WITH DIFFERENTIAL/PLATELET  BASIC METABOLIC PANEL  PREGNANCY, URINE  HEPATIC FUNCTION PANEL  HEPATITIS PANEL, ACUTE  POC URINE PREG, ED    EKG  EKG Interpretation None       Radiology Ct Renal Stone Study  Result Date: 10/29/2015 CLINICAL DATA:  Right flank pain, dysuria for 1 week. History of hypertension. EXAM: CT ABDOMEN AND PELVIS WITHOUT CONTRAST TECHNIQUE: Multidetector CT imaging of the abdomen and pelvis was performed following the standard protocol without IV contrast. COMPARISON:  None. FINDINGS: LOWER CHEST: Lung bases are clear. The visualized heart size is normal. No pericardial effusion. HEPATOBILIARY: Liver is diffusely hypodense compatible with hepatic steatosis with mild focal fatty sparing about the gallbladder fossa. PANCREAS: Normal. SPLEEN: Normal. ADRENALS/URINARY TRACT: Kidneys are orthotopic, demonstrating normal size and morphology. No nephrolithiasis, hydronephrosis; limited assessment for renal masses on this nonenhanced examination. The unopacified ureters are normal in course and caliber. Urinary bladder is partially distended and unremarkable. Normal adrenal glands. STOMACH/BOWEL: The stomach, small and large bowel are normal in course and caliber without inflammatory changes, the sensitivity may be decreased by lack of enteric contrast. Mild colonic diverticulosis. Normal appendix. VASCULAR/LYMPHATIC:  Aortoiliac vessels are normal in course and caliber. No lymphadenopathy by CT size criteria. REPRODUCTIVE: Normal. OTHER: Trace perihepatic free fluid. MUSCULOSKELETAL: Non-acute. IMPRESSION: No urolithiasis or obstructive uropathy. Severe hepatic steatosis, possible superimposed hepatitis. Trace perihepatic free fluid. Recommend correlation with liver function test. Electronically Signed   By: Awilda Metro M.D.   On: 10/29/2015 06:08    Procedures Procedures (including critical care time)  Medications Ordered in ED Medications  HYDROmorphone (DILAUDID) injection 1 mg (not administered)  sodium chloride 0.9 % bolus 1,000 mL (0 mLs Intravenous Stopped 10/29/15 0359)  ketorolac (TORADOL) 30 MG/ML injection 30 mg (30 mg Intravenous Given 10/29/15 0119)  ondansetron (ZOFRAN) injection 4 mg (4 mg Intravenous Given 10/29/15 0119)  cefTRIAXone (ROCEPHIN) 1 g in dextrose 5 % 50 mL IVPB (0 g Intravenous Stopped 10/29/15 0400)  fentaNYL (SUBLIMAZE) injection 50 mcg (50 mcg Intravenous Given 10/29/15 0121)  HYDROmorphone (DILAUDID) injection 1 mg (1 mg Intravenous Given 10/29/15 0410)     Initial Impression / Assessment and Plan / ED Course  I have reviewed the triage vital signs  and the nursing notes.  Pertinent labs & imaging results that were available during my care of the patient were reviewed by me and considered in my medical decision making (see chart for details).  Clinical Course    4:52 AM CT renal study continues to be delayed due to lab delay in pregnancy test.  6:16 AM CT negative for stone or other urologic abnormality. There is severe hepatic steatosis visualized with possible superimposed hepatitis. Apparently I forgot to order LFTs earlier; have added now. Pt's pain is improved though not resolved. Will give additional pain meds. She has no nausea.   At end of shift will sign out to A. Meyer, PA-C. Dispo pending LFTs.   Final Clinical Impressions(s) / ED Diagnoses   Final  diagnoses:  Urinary tract infection without hematuria, site unspecified  Right flank pain  Hepatic steatosis    New Prescriptions New Prescriptions   No medications on file     Carlene CoriaSerena Y Missey Hasley, Cordelia Poche-C 10/29/15 [id number]    Arby BarretteMarcy Pfeiffer, MD 10/29/15 2309

## 2015-10-29 NOTE — ED Notes (Signed)
Spoke with lab about add on pregnancy test pending. Per lab they will complete now.

## 2015-10-29 NOTE — ED Notes (Signed)
Pt resting. Waiting for MD to reassess

## 2015-10-29 NOTE — ED Provider Notes (Signed)
6:52 AM: Assumed care at sign out from [name], [place].   Gina GottronCeleste L Swantek is a 35 y.o. female presents with one week h/o right flank pain and dysuria. Right CVA tenderness and right abdominal tenderness on exam with guarding. U/A remarkable for UTI. CT negative for stone; however, severe hepatic steatosis with possible superimposed hepatitis. Question of acute hepatitis - pending LFTs. nml LFTS d/c home with keflex for UTI. Elevated LFTs admit to medicine.  Pain improved following tx.   8:29 AM: On re-evaluation, pt endorses pain level 6/10. Mild TTP of suprapubic and right abdomen with mild guarding. Pt endorses improvement. LFTs mildly elevated, just outside normal. Will d/c patient with Rx ABX and pain medicine. Review of [place] controlled substance database shows no recent narcotic rx. Follow up with GI and PCP. Return precautions given. Pt voiced understanding and is agreeable.    Herminio Commonsshley Laurel GranjenoMeyer, [place]-C 10/29/15 [id number]    Arby BarretteMarcy Pfeiffer, MD 10/29/15 [phone]

## 2015-10-30 LAB — HEPATITIS PANEL, ACUTE
HEP A IGM: NEGATIVE
HEP B S AG: NEGATIVE
Hep B C IgM: NEGATIVE

## 2015-10-30 LAB — URINE CULTURE

## 2015-10-31 ENCOUNTER — Telehealth (HOSPITAL_BASED_OUTPATIENT_CLINIC_OR_DEPARTMENT_OTHER): Payer: Self-pay

## 2015-10-31 NOTE — Telephone Encounter (Signed)
Post ED Visit - Positive Culture Follow-up  Culture report reviewed by antimicrobial stewardship pharmacist:  []  Gina [name], Pharm.D. []  Gina [name], Pharm.D., BCPS []  Gina [name], Pharm.D. []  Gina [name], Pharm.D., BCPS []  Gina [name], [address]Ph[address] BCPS, AAHIVP []  Gina [name], Pharm.D., BCPS, AAHIVP []  Gina [name], Pharm.D. []  Gina [name], [address]Ph[address] Casilda Carlsaylor Stone Pharm D Positive urine culture Treated with Cephalexin, organism sensitive to the same and no further patient follow-up is required at this time.  Gina [name], Gina [name] 10/31/2015, 9:07 AM

## 2015-11-12 ENCOUNTER — Ambulatory Visit: Payer: Self-pay | Admitting: Neurology

## 2015-11-15 ENCOUNTER — Encounter (HOSPITAL_COMMUNITY): Payer: Self-pay | Admitting: Emergency Medicine

## 2015-11-15 ENCOUNTER — Emergency Department (HOSPITAL_COMMUNITY)
Admission: EM | Admit: 2015-11-15 | Discharge: 2015-11-15 | Disposition: A | Discharge status: Home / Self Care | Payer: Medicaid Other | Attending: Emergency Medicine | Admitting: Emergency Medicine

## 2015-11-15 DIAGNOSIS — Y990 Civilian activity done for income or pay: Secondary | ICD-10-CM | POA: Insufficient documentation

## 2015-11-15 DIAGNOSIS — Y929 Unspecified place or not applicable: Secondary | ICD-10-CM | POA: Insufficient documentation

## 2015-11-15 DIAGNOSIS — X500XXA Overexertion from strenuous movement or load, initial encounter: Secondary | ICD-10-CM | POA: Insufficient documentation

## 2015-11-15 DIAGNOSIS — M545 Low back pain, unspecified: Secondary | ICD-10-CM

## 2015-11-15 DIAGNOSIS — Y939 Activity, unspecified: Secondary | ICD-10-CM | POA: Insufficient documentation

## 2015-11-15 DIAGNOSIS — I10 Essential (primary) hypertension: Secondary | ICD-10-CM | POA: Insufficient documentation

## 2015-11-15 DIAGNOSIS — R300 Dysuria: Secondary | ICD-10-CM | POA: Insufficient documentation

## 2015-11-15 LAB — URINE MICROSCOPIC-ADD ON

## 2015-11-15 LAB — URINALYSIS, ROUTINE W REFLEX MICROSCOPIC
Bilirubin Urine: NEGATIVE
Glucose, UA: NEGATIVE mg/dL
Hgb urine dipstick: NEGATIVE
KETONES UR: NEGATIVE mg/dL
NITRITE: NEGATIVE
PROTEIN: NEGATIVE mg/dL
Specific Gravity, Urine: 1.028 (ref 1.005–1.030)
pH: 6 (ref 5.0–8.0)

## 2015-11-15 LAB — I-STAT BETA HCG BLOOD, ED (MC, WL, AP ONLY)

## 2015-11-15 LAB — BASIC METABOLIC PANEL
ANION GAP: 8 (ref 5–15)
BUN: 12 mg/dL (ref 6–20)
CALCIUM: 9.2 mg/dL (ref 8.9–10.3)
CO2: 23 mmol/L (ref 22–32)
Chloride: 104 mmol/L (ref 101–111)
Creatinine, Ser: 0.84 mg/dL (ref 0.44–1.00)
GLUCOSE: 118 mg/dL — AB (ref 65–99)
Potassium: 3.4 mmol/L — ABNORMAL LOW (ref 3.5–5.1)
Sodium: 135 mmol/L (ref 135–145)

## 2015-11-15 LAB — CBC
HCT: 38.2 % (ref 36.0–46.0)
Hemoglobin: 13.3 g/dL (ref 12.0–15.0)
MCH: 30.6 pg (ref 26.0–34.0)
MCHC: 34.8 g/dL (ref 30.0–36.0)
MCV: 87.8 fL (ref 78.0–100.0)
PLATELETS: 279 10*3/uL (ref 150–400)
RBC: 4.35 MIL/uL (ref 3.87–5.11)
RDW: 12.4 % (ref 11.5–15.5)
WBC: 5.3 10*3/uL (ref 4.0–10.5)

## 2015-11-15 NOTE — ED Triage Notes (Signed)
Patient c/o right flank/side pain that been going on for week. [facility] couldn't figure out what causing pain.  Patient denies urinary problems, v/v states little nausea.

## 2015-11-15 NOTE — Discharge Instructions (Signed)
Avoid lifting and bending.  Use heat on the sore area of your back, 3 or 4 times a day.

## 2015-11-15 NOTE — ED Notes (Signed)
Patient was alert, oriented and stable upon discharge. RN went over AVS and patient had no further questions.  

## 2015-11-15 NOTE — ED Provider Notes (Signed)
[facility]-EMERGENCY DEPT Provider Note   CSN: [id number] Arrival date & time: 11/15/15  1258     History   Chief Complaint Chief Complaint  Patient presents with  . Flank Pain    HPI Gina [name] is a 35 y.o. female.  She presents for evaluation of ongoing right lower back pain for several weeks, without trauma. She was evaluated in the ED, 10/29/2015 for the same problem, and diagnosed with UTI. She was treated with ABX. She has mild persistent dysuria without hematuria, fever, chills, nausea, vomiting. She denies bowel or bladder incontinence. She works a job, as a Production assistant, radio, which requires bending and lifting. She had incidental finding of mild LFT elevation with hepatic steatosis, but has not followed up with gastroenterology yet. She also did not see her PCP, as requested. There are no other known modifying factors.  HPI  Past Medical History:  Diagnosis Date  . Herpes genitalia   . Hypertension   . Migraines     Patient Active Problem List   Diagnosis Date Noted  . Migraine headache 04/15/2015  . Generalized anxiety disorder 02/06/2015  . Cephalalgia 01/05/2015  . Genital herpes 01/05/2015  . Seasonal allergies 01/05/2015  . Obesity 01/05/2015  . History of hypertension 01/05/2015    Past Surgical History:  Procedure Laterality Date  . TUBAL LIGATION      OB History    No data available       Home Medications    Prior to Admission medications   Medication Sig Start Date End Date Taking? Authorizing Provider  ondansetron (ZOFRAN ODT) 8 MG disintegrating tablet Take 1 tablet (8 mg total) by mouth every 8 (eight) hours as needed for nausea or vomiting. 08/21/15   Henrietta Hoover, NP  SUMAtriptan (IMITREX) 20 MG/ACT nasal spray 1 spray in one nostril.  May repeat once in 2 hours if headache persists or recurs. Patient taking differently: Place 20 mg into the nose every 2 (two) hours as needed for migraine. 1 spray in one nostril.  May repeat once in 2 hours if  headache persists or recurs. 08/21/15   Henrietta Hoover, NP  traMADol (ULTRAM) 50 MG tablet Take 1 tablet (50 mg total) by mouth every 12 (twelve) hours as needed. 10/29/15   Lona Kettle, PA-C    Family History Family History  Problem Relation Age of Onset  . Diabetes Mother     Social History Social History  Substance Use Topics  . Smoking status: Never Smoker  . Smokeless tobacco: Never Used  . Alcohol use No     Allergies   Review of patient's allergies indicates no known allergies.   Review of Systems Review of Systems  All other systems reviewed and are negative.    Physical Exam Updated Vital Signs BP 142/94 (BP Location: Left Arm)   Pulse 66   Temp 98.2 F (36.8 C) (Oral)   Resp 18   LMP 10/21/2015 (Approximate)   SpO2 100%   Physical Exam  Constitutional: She is oriented to person, place, and time. She appears well-developed and well-nourished. No distress.  HENT:  Head: Normocephalic and atraumatic.  Eyes: Conjunctivae and EOM are normal. Pupils are equal, round, and reactive to light.  Neck: Normal range of motion and phonation normal. Neck supple.  Cardiovascular: Normal rate and regular rhythm.   Pulmonary/Chest: Effort normal and breath sounds normal. She exhibits no tenderness.  Abdominal: Soft. She exhibits no distension. There is no tenderness. There is no guarding.  Musculoskeletal:  Right lumbar tenderness, aggravated by movement in this area. No tenderness to palpation of the thoracic or lumbar spines. Straight leg raising, right causes pain in the right lumbar region. No altered sensation of the right leg.  Neurological: She is alert and oriented to person, place, and time. She exhibits normal muscle tone.  Skin: Skin is warm and dry. No rash noted. No erythema. No pallor.  Psychiatric: She has a normal mood and affect. Her behavior is normal. Judgment and thought content normal.  Nursing note and vitals reviewed.    ED Treatments /  Results  Labs (all labs ordered are listed, but only abnormal results are displayed) Labs Reviewed  URINALYSIS, ROUTINE W REFLEX MICROSCOPIC (NOT AT [facility]) - Abnormal; Notable for the following:       Result Value   APPearance CLOUDY (*)    Leukocytes, UA TRACE (*)    All other components within normal limits  BASIC METABOLIC PANEL - Abnormal; Notable for the following:    Potassium 3.4 (*)    Glucose, Bld 118 (*)    All other components within normal limits  URINE MICROSCOPIC-ADD ON - Abnormal; Notable for the following:    Squamous Epithelial / LPF 6-30 (*)    Bacteria, UA FEW (*)    All other components within normal limits  URINE CULTURE  CBC  I-STAT BETA HCG BLOOD, ED ([facility], [facility], [facility] ONLY)    EKG  EKG Interpretation None       Radiology No results found.  Procedures Procedures (including critical care time)  Medications Ordered in ED Medications - No data to display   Initial Impression / Assessment and Plan / ED Course  I have reviewed the triage vital signs and the nursing notes.  Pertinent labs & imaging results that were available during my care of the patient were reviewed by me and considered in my medical decision making (see chart for details).  Clinical Course    Medications - No data to display  Patient Vitals for the past 24 hrs:  BP Temp Temp src Pulse Resp SpO2  11/15/15 1609 142/94 - - 66 18 100 %  11/15/15 1315 135/83 98.2 F (36.8 C) Oral 67 18 98 %    5:10 PM Reevaluation with update and discussion. After initial assessment and treatment, an updated evaluation reveals No change in clinical status. Findings discussed with patient and all questions answered. Tedford Berg L    Final Clinical Impressions(s) / ED Diagnoses   Final diagnoses:  Right-sided low back pain without sciatica, unspecified chronicity    Right lumbar pain, likely muscular in origin, without trauma. Doubt lumbar radiculopathy, fracture, pyelonephritis, cellulitis or  dermatologic abnormality.  Nursing Notes Reviewed/ Care Coordinated Applicable Imaging Reviewed Interpretation of Laboratory Data incorporated into ED treatment  The patient appears reasonably screened and/or stabilized for discharge and I doubt any other medical condition or other [facility] requiring further screening, evaluation, or treatment in the ED at this time prior to discharge.  Plan: Home Medications- IBU; Home Treatments-work release for 2 days, Heat; return here if the recommended treatment, does not improve the symptoms; Recommended follow up- Repeat urine culture. PCP check up in 1 week   New Prescriptions New Prescriptions   No medications on file     Mancel BaleElliott Aayliah Rotenberry, MD 11/15/15 1729

## 2015-11-17 LAB — URINE CULTURE
Culture: NO GROWTH
SPECIAL REQUESTS: NORMAL

## 2015-11-23 ENCOUNTER — Encounter (HOSPITAL_COMMUNITY): Payer: Self-pay | Admitting: Emergency Medicine

## 2015-11-23 ENCOUNTER — Emergency Department (HOSPITAL_COMMUNITY)
Admission: EM | Admit: 2015-11-23 | Discharge: 2015-11-23 | Disposition: A | Discharge status: Home / Self Care | Payer: Medicaid Other | Attending: Emergency Medicine | Admitting: Emergency Medicine

## 2015-11-23 DIAGNOSIS — X58XXXA Exposure to other specified factors, initial encounter: Secondary | ICD-10-CM | POA: Insufficient documentation

## 2015-11-23 DIAGNOSIS — Y929 Unspecified place or not applicable: Secondary | ICD-10-CM | POA: Insufficient documentation

## 2015-11-23 DIAGNOSIS — M62838 Other muscle spasm: Secondary | ICD-10-CM

## 2015-11-23 DIAGNOSIS — Z79899 Other long term (current) drug therapy: Secondary | ICD-10-CM | POA: Insufficient documentation

## 2015-11-23 DIAGNOSIS — Y939 Activity, unspecified: Secondary | ICD-10-CM | POA: Insufficient documentation

## 2015-11-23 DIAGNOSIS — Y999 Unspecified external cause status: Secondary | ICD-10-CM | POA: Insufficient documentation

## 2015-11-23 DIAGNOSIS — I10 Essential (primary) hypertension: Secondary | ICD-10-CM | POA: Insufficient documentation

## 2015-11-23 DIAGNOSIS — S39012A Strain of muscle, fascia and tendon of lower back, initial encounter: Secondary | ICD-10-CM | POA: Insufficient documentation

## 2015-11-23 DIAGNOSIS — Z7951 Long term (current) use of inhaled steroids: Secondary | ICD-10-CM | POA: Insufficient documentation

## 2015-11-23 DIAGNOSIS — T148XXA Other injury of unspecified body region, initial encounter: Secondary | ICD-10-CM

## 2015-11-23 MED ORDER — CYCLOBENZAPRINE HCL 5 MG PO TABS: 5.0000 mg | ORAL_TABLET | Freq: Two times a day (BID) | ORAL | 0 refills | Status: AC | PRN

## 2015-11-23 MED ORDER — CYCLOBENZAPRINE HCL 10 MG PO TABS
5.0000 mg | ORAL_TABLET | Freq: Once | ORAL | Status: AC
Start: 2015-11-23 — End: 2015-11-23
  Administered 2015-11-23: 5 mg via ORAL
  Filled 2015-11-23: qty 1

## 2015-11-23 MED ORDER — KETOROLAC TROMETHAMINE 60 MG/2ML IM SOLN
30.0000 mg | Freq: Once | INTRAMUSCULAR | Status: AC
  Administered 2015-11-23: 30 mg via INTRAMUSCULAR
  Filled 2015-11-23: qty 2

## 2015-11-23 MED FILL — CYCLOBENZAPRINE 5 MG TABLET: 5 | 5 days supply | Qty: 10 | Fill #0

## 2015-11-23 NOTE — ED Provider Notes (Signed)
[facility]-EMERGENCY DEPT Provider Note   CSN: [id number] Arrival date & time: 11/23/15  4098     History   Chief Complaint Chief Complaint  Patient presents with  . Back Pain    HPI Gina [name] is a 35 y.o. female.  The history is provided by the patient.  Back Pain   This is a recurrent problem. Episode onset: several weeks. The problem occurs constantly. Progression since onset: fluctuating. The pain is associated with no known injury. The pain is present in the lumbar spine and sacro-iliac joint. The quality of the pain is described as aching. The pain is severe. The symptoms are aggravated by certain positions and twisting. The pain is the same all the time. Pertinent negatives include no chest pain, no fever, no numbness, no abdominal pain, no abdominal swelling, no bowel incontinence, no perianal numbness, no bladder incontinence, no dysuria, no pelvic pain and no paresthesias. She has tried NSAIDs for the symptoms. Risk factors include obesity.    Past Medical History:  Diagnosis Date  . Herpes genitalia   . Hypertension   . Migraines     Patient Active Problem List   Diagnosis Date Noted  . Migraine headache 04/15/2015  . Generalized anxiety disorder 02/06/2015  . Cephalalgia 01/05/2015  . Genital herpes 01/05/2015  . Seasonal allergies 01/05/2015  . Obesity 01/05/2015  . History of hypertension 01/05/2015    Past Surgical History:  Procedure Laterality Date  . TUBAL LIGATION      OB History    No data available       Home Medications    Prior to Admission medications   Medication Sig Start Date End Date Taking? Authorizing Provider  ibuprofen (ADVIL,MOTRIN) 200 MG tablet Take 400 mg by mouth every 6 (six) hours as needed for moderate pain.   Yes Historical Provider, MD  cyclobenzaprine (FLEXERIL) 5 MG tablet Take 1 tablet (5 mg total) by mouth 2 (two) times daily as needed for muscle spasms. 11/23/15 11/28/15  Nira Conn, MD  ondansetron  (ZOFRAN ODT) 8 MG disintegrating tablet Take 1 tablet (8 mg total) by mouth every 8 (eight) hours as needed for nausea or vomiting. Patient not taking: Reported on 11/15/2015 08/21/15   Henrietta Hoover, NP  SUMAtriptan [facility]) 20 MG/ACT nasal spray 1 spray in one nostril.  May repeat once in 2 hours if headache persists or recurs. Patient not taking: Reported on 11/15/2015 08/21/15   Henrietta Hoover, NP  traMADol (ULTRAM) 50 MG tablet Take 1 tablet (50 mg total) by mouth every 12 (twelve) hours as needed. Patient not taking: Reported on 11/15/2015 10/29/15   Lona Kettle, PA-C    Family History Family History  Problem Relation Age of Onset  . Diabetes Mother     Social History Social History  Substance Use Topics  . Smoking status: Never Smoker  . Smokeless tobacco: Never Used  . Alcohol use No     Allergies   Review of patient's allergies indicates no known allergies.   Review of Systems Review of Systems  Constitutional: Negative for chills and fever.  HENT: Negative for ear pain and sore throat.   Eyes: Negative for pain and visual disturbance.  Respiratory: Negative for cough and shortness of breath.   Cardiovascular: Negative for chest pain and palpitations.  Gastrointestinal: Negative for abdominal pain, bowel incontinence and vomiting.  Genitourinary: Negative for bladder incontinence, dysuria, hematuria and pelvic pain.  Musculoskeletal: Positive for back pain. Negative for arthralgias.  Skin: Negative for color change and rash.  Neurological: Negative for seizures, syncope, numbness and paresthesias.  All other systems reviewed and are negative.    Physical Exam Updated Vital Signs BP 125/74 (BP Location: Right Arm)   Pulse 73   Temp 98.4 F (36.9 C) (Oral)   Resp 18   LMP 10/21/2015 (Approximate)   SpO2 98%   Physical Exam  Constitutional: She is oriented to person, place, and time. She appears well-developed and well-nourished. No distress.    HENT:  Head: Normocephalic and atraumatic.  Nose: Nose normal.  Eyes: Conjunctivae and EOM are normal. Pupils are equal, round, and reactive to light. Right eye exhibits no discharge. Left eye exhibits no discharge. No scleral icterus.  Neck: Normal range of motion. Neck supple.  Cardiovascular: Normal rate and regular rhythm.  Exam reveals no gallop and no friction rub.   No murmur heard. Pulmonary/Chest: Effort normal and breath sounds normal. No stridor. No respiratory distress. She has no rales.  Abdominal: Soft. She exhibits no distension. There is no tenderness. There is no rigidity, no guarding, no tenderness at McBurney's point and negative Murphy's sign.  Musculoskeletal: She exhibits no edema.       Lumbar back: She exhibits tenderness. She exhibits no bony tenderness.       Back:  Neurological: She is alert and oriented to person, place, and time.  Skin: Skin is warm and dry. No rash noted. She is not diaphoretic. No erythema.  Psychiatric: She has a normal mood and affect.  Vitals reviewed.    ED Treatments / Results  Labs (all labs ordered are listed, but only abnormal results are displayed) Labs Reviewed - No data to display  EKG  EKG Interpretation None       Radiology No results found.  Procedures Procedures (including critical care time)  Medications Ordered in ED Medications  cyclobenzaprine (FLEXERIL) tablet 5 mg (5 mg Oral Given 11/23/15 1015)  ketorolac (TORADOL) injection 30 mg (30 mg Intramuscular Given 11/23/15 1015)     Initial Impression / Assessment and Plan / ED Course  I have reviewed the triage vital signs and the nursing notes.  Pertinent labs & imaging results that were available during my care of the patient were reviewed by me and considered in my medical decision making (see chart for details).  Clinical Course    Muscle strain/spams. No renal stones on recent CT. No symptoms of UTI or Pyelo. Home remedy recommended with heat  therapy, massage, stretching, TENS machine, NSAIDS. Will provide muscle relaxer Rx.  The patient is safe for discharge with strict return precautions.   Final Clinical Impressions(s) / ED Diagnoses   Final diagnoses:  Muscle strain  Muscle spasm   Disposition: Discharge  Condition: Good  I have discussed the results, Dx and Tx plan with the patient who expressed understanding and agree(s) with the plan. Discharge instructions discussed at great length. The patient was given strict return precautions who verbalized understanding of the instructions. No further questions at time of discharge.    New Prescriptions   CYCLOBENZAPRINE (FLEXERIL) 5 MG TABLET    Take 1 tablet (5 mg total) by mouth 2 (two) times daily as needed for muscle spasms.    Follow [facility] [address] [place] [phone] Call  As needed      Nira ConnPedro Eduardo Decklyn Hyder, MD 11/23/15 [phone]

## 2015-11-23 NOTE — ED Triage Notes (Signed)
Pt presents with complaint ofsevere lower right back pain.  Pt states that she has been seen several times before and has had x-rays but has been told that "they don't see anything," but she knows something is wrong because of how severe the pain is.  Pt guards and pulls away when lower back is palpated.  No deformity or swelling noted.

## 2015-12-31 ENCOUNTER — Encounter (HOSPITAL_COMMUNITY): Payer: Self-pay | Admitting: Vascular Surgery

## 2015-12-31 ENCOUNTER — Emergency Department (HOSPITAL_COMMUNITY)
Admission: EM | Admit: 2015-12-31 | Discharge: 2015-12-31 | Disposition: A | Discharge status: Home / Self Care | Payer: Medicaid Other | Attending: Emergency Medicine | Admitting: Emergency Medicine

## 2015-12-31 DIAGNOSIS — R109 Unspecified abdominal pain: Secondary | ICD-10-CM | POA: Insufficient documentation

## 2015-12-31 DIAGNOSIS — I10 Essential (primary) hypertension: Secondary | ICD-10-CM | POA: Insufficient documentation

## 2015-12-31 LAB — CBC
HEMATOCRIT: 38.1 % (ref 36.0–46.0)
HEMOGLOBIN: 12.9 g/dL (ref 12.0–15.0)
MCH: 30.3 pg (ref 26.0–34.0)
MCHC: 33.9 g/dL (ref 30.0–36.0)
MCV: 89.4 fL (ref 78.0–100.0)
Platelets: 300 10*3/uL (ref 150–400)
RBC: 4.26 MIL/uL (ref 3.87–5.11)
RDW: 12.7 % (ref 11.5–15.5)
WBC: 5.8 10*3/uL (ref 4.0–10.5)

## 2015-12-31 LAB — COMPREHENSIVE METABOLIC PANEL
ALBUMIN: 4 g/dL (ref 3.5–5.0)
ALT: 66 U/L — ABNORMAL HIGH (ref 14–54)
ANION GAP: 10 (ref 5–15)
AST: 63 U/L — ABNORMAL HIGH (ref 15–41)
Alkaline Phosphatase: 50 U/L (ref 38–126)
BILIRUBIN TOTAL: 0.8 mg/dL (ref 0.3–1.2)
BUN: 9 mg/dL (ref 6–20)
CO2: 24 mmol/L (ref 22–32)
Calcium: 9.7 mg/dL (ref 8.9–10.3)
Chloride: 102 mmol/L (ref 101–111)
Creatinine, Ser: 0.85 mg/dL (ref 0.44–1.00)
GFR calc non Af Amer: >60 mL/min (ref >60)
GLUCOSE: 126 mg/dL — AB (ref 65–99)
POTASSIUM: 3.4 mmol/L — AB (ref 3.5–5.1)
Sodium: 136 mmol/L (ref 135–145)
TOTAL PROTEIN: 7.9 g/dL (ref 6.5–8.1)

## 2015-12-31 LAB — URINALYSIS, ROUTINE W REFLEX MICROSCOPIC
Bilirubin Urine: NEGATIVE
Glucose, UA: NEGATIVE mg/dL
Hgb urine dipstick: NEGATIVE
Ketones, ur: NEGATIVE mg/dL
NITRITE: NEGATIVE
PH: 5 (ref 5.0–8.0)
Protein, ur: NEGATIVE mg/dL
SPECIFIC GRAVITY, URINE: 1.018 (ref 1.005–1.030)

## 2015-12-31 LAB — LIPASE, BLOOD: Lipase: 24 U/L (ref 11–51)

## 2015-12-31 LAB — I-STAT BETA HCG BLOOD, ED (MC, WL, AP ONLY)

## 2015-12-31 LAB — URINE MICROSCOPIC-ADD ON: RBC / HPF: NONE SEEN RBC/hpf (ref 0–5)

## 2015-12-31 MED ORDER — DIAZEPAM 5 MG PO TABS
5.0000 mg | ORAL_TABLET | Freq: Once | ORAL | Status: AC
  Administered 2015-12-31: 5 mg via ORAL
  Filled 2015-12-31: qty 1

## 2015-12-31 MED ORDER — HYDROCODONE-ACETAMINOPHEN 5-325 MG PO TABS: 2.0000 | ORAL_TABLET | ORAL | 0 refills | Status: DC | PRN

## 2015-12-31 MED ORDER — METHOCARBAMOL 750 MG PO TABS: 750.0000 mg | ORAL_TABLET | Freq: Four times a day (QID) | ORAL | 0 refills | Status: DC

## 2015-12-31 MED ORDER — OXYCODONE-ACETAMINOPHEN 5-325 MG PO TABS
2.0000 | ORAL_TABLET | Freq: Once | ORAL | Status: AC
  Administered 2015-12-31: 2 via ORAL
  Filled 2015-12-31: qty 2

## 2015-12-31 NOTE — ED Triage Notes (Addendum)
Pt reports to the ED for eval of right flank/back/abd pain that radiates into her neck. Pt has been seen here multiple times, she cannot remember what she was dx with exactly but states "something to do with my kidney." Reports some nausea but denies any active V/D. Denies any hematuria or urinary symptoms.

## 2015-12-31 NOTE — ED Provider Notes (Signed)
[facility] Provider Note   CSN: [id number] Arrival date & time: 12/31/15  1809  By signing my name below, I, Octavia Heirrianna Nassar, attest that this documentation has been prepared under the direction and in the presence of Lorre NickAnthony Agnes Probert, MD.  Electronically Signed: Octavia HeirArianna Nassar, ED Scribe. 12/31/15. 7:42 PM.    History   Chief Complaint Chief Complaint  Patient presents with  . Flank Pain    The history is provided by the patient. No language interpreter was used.   HPI Comments: Gina [name] is a 35 y.o. female who has a PMhx of herpes, HTN, and migraines presents to the Emergency Department complaining of recurrent, gradual worsening right flank pain x a few months. It has been gradually worsening over the past few days. She describes her pain as sharp and it radiates into the right side of her neck. She reports feeling off balance when she ambulates. Pt was seen and evaluated for similar pain in the past but states she has not gotten any better. Pt reports that she does a lot of standing at work and her pain worsens when she gets off. She denies dysuria, hematuria, urinary frequency, or difficulty urinating.   Past Medical History:  Diagnosis Date  . Herpes genitalia   . Hypertension   . Migraines     Patient Active Problem List   Diagnosis Date Noted  . Migraine headache 04/15/2015  . Generalized anxiety disorder 02/06/2015  . Cephalalgia 01/05/2015  . Genital herpes 01/05/2015  . Seasonal allergies 01/05/2015  . Obesity 01/05/2015  . History of hypertension 01/05/2015    Past Surgical History:  Procedure Laterality Date  . TUBAL LIGATION      OB History    No data available       Home Medications    Prior to Admission medications   Medication Sig Start Date End Date Taking? Authorizing Provider  ibuprofen (ADVIL,MOTRIN) 200 MG tablet Take 400 mg by mouth every 6 (six) hours as needed for moderate pain.    Historical Provider, MD  ondansetron  (ZOFRAN ODT) 8 MG disintegrating tablet Take 1 tablet (8 mg total) by mouth every 8 (eight) hours as needed for nausea or vomiting. Patient not taking: Reported on 11/15/2015 08/21/15   Henrietta HooverLinda C Bernhardt, NP  SUMAtriptan [facility](IMITREX) 20 MG/ACT nasal spray 1 spray in one nostril.  May repeat once in 2 hours if headache persists or recurs. Patient not taking: Reported on 11/15/2015 08/21/15   Henrietta HooverLinda C Bernhardt, NP  traMADol (ULTRAM) 50 MG tablet Take 1 tablet (50 mg total) by mouth every 12 (twelve) hours as needed. Patient not taking: Reported on 11/15/2015 10/29/15   Lona KettleAshley Laurel Meyer, PA-C    Family History Family History  Problem Relation Age of Onset  . Diabetes Mother     Social History Social History  Substance Use Topics  . Smoking status: Never Smoker  . Smokeless tobacco: Never Used  . Alcohol use No     Allergies   Patient has no known allergies.   Review of Systems Review of Systems  Genitourinary: Positive for flank pain. Negative for difficulty urinating, frequency and hematuria.  All other systems reviewed and are negative.    Physical Exam Updated Vital Signs BP 117/84 (BP Location: Left Arm)   Pulse 103   Temp 97.3 F (36.3 C) (Oral)   Resp 16   SpO2 99%   Physical Exam  Constitutional: She is oriented to person, place, and time. She appears well-developed and  well-nourished.  Non-toxic appearance. No distress.  HENT:  Head: Normocephalic and atraumatic.  Eyes: Conjunctivae, EOM and lids are normal. Pupils are equal, round, and reactive to light.  Neck: Normal range of motion. Neck supple. No tracheal deviation present. No thyroid mass present.  Cardiovascular: Normal rate, regular rhythm and normal heart sounds.  Exam reveals no gallop.   No murmur heard. Pulmonary/Chest: Effort normal and breath sounds normal. No stridor. No respiratory distress. She has no decreased breath sounds. She has no wheezes. She has no rhonchi. She has no rales.  Abdominal:  Soft. Normal appearance and bowel sounds are normal. She exhibits no distension. There is no tenderness. There is no rebound and no CVA tenderness.  Musculoskeletal: Normal range of motion. She exhibits tenderness. She exhibits no edema.  Tenderness to the right T and L paraspinal muscles  Neurological: She is alert and oriented to person, place, and time. She has normal strength. No cranial nerve deficit or sensory deficit. GCS eye subscore is 4. GCS verbal subscore is 5. GCS motor subscore is 6.  Skin: Skin is warm and dry. No abrasion and no rash noted.  Psychiatric: She has a normal mood and affect. Her speech is normal and behavior is normal.  Nursing note and vitals reviewed.    ED Treatments / Results  DIAGNOSTIC STUDIES: Oxygen Saturation is 99% on RA, normal by my interpretation.  COORDINATION OF CARE:  7:40 PM Discussed treatment plan with pt at bedside and pt agreed to plan.  Labs (all labs ordered are listed, but only abnormal results are displayed) Labs Reviewed  COMPREHENSIVE METABOLIC PANEL - Abnormal; Notable for the following:       Result Value   Potassium 3.4 (*)    Glucose, Bld 126 (*)    AST 63 (*)    ALT 66 (*)    All other components within normal limits  LIPASE, BLOOD  CBC  URINALYSIS, ROUTINE W REFLEX MICROSCOPIC (NOT AT [facility])  I-STAT BETA HCG BLOOD, ED ([facility], [facility], [facility] ONLY)    EKG  EKG Interpretation None       Radiology No results found.  Procedures Procedures (including critical care time)  Medications Ordered in ED Medications - No data to display   Initial Impression / Assessment and Plan / ED Course  I have reviewed the triage vital signs and the nursing notes.  Pertinent labs & imaging results that were available during my care of the patient were reviewed by me and considered in my medical decision making (see chart for details).  Clinical Course     I personally performed the services described in this documentation, which was  scribed in my presence. The recorded information has been reviewed and is accurate.   Patient treated for pain here. Has chronic back pain likely musculoskeletal in nature. We placed on medications and stable for discharge  Final Clinical Impressions(s) / ED Diagnoses   Final diagnoses:  None    New Prescriptions New Prescriptions   No medications on file     Lorre NickAnthony Raymundo Rout, MD 12/31/15 1945

## 2016-01-01 ENCOUNTER — Encounter (HOSPITAL_COMMUNITY): Payer: Self-pay

## 2016-01-01 ENCOUNTER — Emergency Department (HOSPITAL_COMMUNITY)
Admission: EM | Admit: 2016-01-01 | Discharge: 2016-01-01 | Disposition: A | Discharge status: Home / Self Care | Payer: Self-pay | Attending: Emergency Medicine | Admitting: Emergency Medicine

## 2016-01-01 ENCOUNTER — Emergency Department (HOSPITAL_COMMUNITY): Payer: Self-pay

## 2016-01-01 DIAGNOSIS — K760 Fatty (change of) liver, not elsewhere classified: Secondary | ICD-10-CM | POA: Insufficient documentation

## 2016-01-01 DIAGNOSIS — R1010 Upper abdominal pain, unspecified: Secondary | ICD-10-CM

## 2016-01-01 DIAGNOSIS — I10 Essential (primary) hypertension: Secondary | ICD-10-CM | POA: Insufficient documentation

## 2016-01-01 LAB — LIPASE, BLOOD: Lipase: 20 U/L (ref 11–51)

## 2016-01-01 LAB — I-STAT BETA HCG BLOOD, ED (MC, WL, AP ONLY): I-stat hCG, quantitative: <5 m[IU]/mL (ref <5)

## 2016-01-01 LAB — CBC
HCT: 37.3 % (ref 36.0–46.0)
Hemoglobin: 12.7 g/dL (ref 12.0–15.0)
MCH: 30.4 pg (ref 26.0–34.0)
MCHC: 34 g/dL (ref 30.0–36.0)
MCV: 89.2 fL (ref 78.0–100.0)
PLATELETS: 273 10*3/uL (ref 150–400)
RBC: 4.18 MIL/uL (ref 3.87–5.11)
RDW: 12.7 % (ref 11.5–15.5)
WBC: 5.6 10*3/uL (ref 4.0–10.5)

## 2016-01-01 LAB — URINALYSIS, ROUTINE W REFLEX MICROSCOPIC
BILIRUBIN URINE: NEGATIVE
GLUCOSE, UA: NEGATIVE mg/dL
Hgb urine dipstick: NEGATIVE
KETONES UR: NEGATIVE mg/dL
NITRITE: NEGATIVE
PROTEIN: NEGATIVE mg/dL
Specific Gravity, Urine: 1.021 (ref 1.005–1.030)
pH: 5 (ref 5.0–8.0)

## 2016-01-01 LAB — URINE MICROSCOPIC-ADD ON

## 2016-01-01 LAB — COMPREHENSIVE METABOLIC PANEL
ALK PHOS: 48 U/L (ref 38–126)
ALT: 61 U/L — AB (ref 14–54)
AST: 52 U/L — AB (ref 15–41)
Albumin: 3.8 g/dL (ref 3.5–5.0)
Anion gap: 9 (ref 5–15)
BILIRUBIN TOTAL: 0.7 mg/dL (ref 0.3–1.2)
BUN: 8 mg/dL (ref 6–20)
CO2: 23 mmol/L (ref 22–32)
CREATININE: 0.76 mg/dL (ref 0.44–1.00)
Calcium: 9.5 mg/dL (ref 8.9–10.3)
Chloride: 104 mmol/L (ref 101–111)
GFR calc Af Amer: >60 mL/min (ref >60)
GLUCOSE: 113 mg/dL — AB (ref 65–99)
Potassium: 3.7 mmol/L (ref 3.5–5.1)
Sodium: 136 mmol/L (ref 135–145)
TOTAL PROTEIN: 7.9 g/dL (ref 6.5–8.1)

## 2016-01-01 MED ORDER — TRAMADOL HCL 50 MG PO TABS: 50.0000 mg | ORAL_TABLET | Freq: Two times a day (BID) | ORAL | 0 refills | Status: DC | PRN

## 2016-01-01 MED ORDER — OXYCODONE-ACETAMINOPHEN 5-325 MG PO TABS: 1.0000 | ORAL_TABLET | Freq: Once | ORAL | Status: DC

## 2016-01-01 MED ORDER — ONDANSETRON 4 MG PO TBDP
ORAL_TABLET | ORAL | Status: AC
  Filled 2016-01-01: qty 1

## 2016-01-01 MED ORDER — ONDANSETRON 4 MG PO TBDP: 4.0000 mg | ORAL_TABLET | Freq: Three times a day (TID) | ORAL | 0 refills | Status: DC | PRN

## 2016-01-01 MED ORDER — GI COCKTAIL ~~LOC~~
30.0000 mL | Freq: Once | ORAL | Status: AC
  Administered 2016-01-01: 30 mL via ORAL
  Filled 2016-01-01: qty 30

## 2016-01-01 MED ORDER — ONDANSETRON 4 MG PO TBDP
4.0000 mg | ORAL_TABLET | Freq: Once | ORAL | Status: AC | PRN
  Administered 2016-01-01: 4 mg via ORAL

## 2016-01-01 MED FILL — traMADol HCL 50 MG TABS: 50 | 5 days supply | Qty: 10 | Fill #0

## 2016-01-01 MED FILL — ONDANSETRON ODT 4 MG TABLET: 4 | 7 days supply | Qty: 20 | Fill #0

## 2016-01-01 MED FILL — METHOCARBAMOL 750 MG TABLET: 750 | 7 days supply | Qty: 30 | Fill #0

## 2016-01-01 NOTE — ED Notes (Signed)
Patient transported to Ultrasound 

## 2016-01-01 NOTE — ED Provider Notes (Signed)
[facility] Provider Note   CSN: [id number] Arrival date & time: 01/01/16  [id number]     History   Chief Complaint Chief Complaint  Patient presents with  . Abdominal Pain    HPI Gina [name] is a 35 y.o. female.  HPI  Pt presenting with c/o epigastric pain.  She states pain is sharp and began before bed last night. Pain has been constant all night.  She denies a change in foods.  No fever/chills.  Did have one episode of emesis last night- nonbilious/nonbloody.  No diarrhea.  No lower abdominal pain or vaginal bleeding or discharge.  She denies having similar symptoms in the past.  She does endorse taking daily ibuprofen for headaches lately.  No blood in stools or melena.  There are no other associated systemic symptoms, there are no other alleviating or modifying factors. She denies ever drinking alcohol.    Past Medical History:  Diagnosis Date  . Herpes genitalia   . Hypertension   . Migraines     Patient Active Problem List   Diagnosis Date Noted  . Migraine headache 04/15/2015  . Generalized anxiety disorder 02/06/2015  . Cephalalgia 01/05/2015  . Genital herpes 01/05/2015  . Seasonal allergies 01/05/2015  . Obesity 01/05/2015  . History of hypertension 01/05/2015    Past Surgical History:  Procedure Laterality Date  . TUBAL LIGATION      OB History    No data available       Home Medications    Prior to Admission medications   Medication Sig Start Date End Date Taking? Authorizing Provider  HYDROcodone-acetaminophen (NORCO/VICODIN) 5-325 MG tablet Take 2 tablets by mouth every 4 (four) hours as needed. 12/31/15   Lorre NickAnthony Allen, MD  ibuprofen (ADVIL,MOTRIN) 200 MG tablet Take 400 mg by mouth every 6 (six) hours as needed for moderate pain.    Historical Provider, MD  methocarbamol (ROBAXIN-750) 750 MG tablet Take 1 tablet (750 mg total) by mouth 4 (four) times daily. 12/31/15   Lorre NickAnthony Allen, MD  ondansetron (ZOFRAN ODT) 4 MG disintegrating tablet  Take 1 tablet (4 mg total) by mouth every 8 (eight) hours as needed for nausea or vomiting. 01/01/16   Jerelyn ScottMartha Linker, MD  SUMAtriptan (IMITREX) 20 MG/ACT nasal spray 1 spray in one nostril.  May repeat once in 2 hours if headache persists or recurs. Patient not taking: Reported on 11/15/2015 08/21/15   Henrietta HooverLinda C Bernhardt, NP  traMADol (ULTRAM) 50 MG tablet Take 1 tablet (50 mg total) by mouth every 12 (twelve) hours as needed. 01/01/16   Jerelyn ScottMartha Linker, MD    Family History Family History  Problem Relation Age of Onset  . Diabetes Mother     Social History Social History  Substance Use Topics  . Smoking status: Never Smoker  . Smokeless tobacco: Never Used  . Alcohol use No     Allergies   Patient has no known allergies.   Review of Systems Review of Systems  ROS reviewed and all otherwise negative except for mentioned in HPI   Physical Exam Updated Vital Signs BP 129/75 (BP Location: Right Arm)   Pulse 82   Temp 98.1 F (36.7 C) (Oral)   Resp 18   Ht 5\' 7"  (1.702 m)   Wt 95.3 kg   LMP 12/27/2015   SpO2 99%   BMI 32.89 kg/m  Vitals reviewed Physical Exam Physical Examination: General appearance - alert, well appearing, and in no distress Mental status - alert, oriented to person,  place, and time Eyes - no conjunctival injection, no scleral icterus Chest - clear to auscultation, no wheezes, rales or rhonchi, symmetric air entry Heart - normal rate, regular rhythm, normal S1, S2, no murmurs, rubs, clicks or gallops Abdomen - soft, ttp in epigastric and both right and left upper quadrant, nabs, nondistended, no masses or organomegaly Neurological - alert, oriented, normal speech Extremities - peripheral pulses normal, no pedal edema, no clubbing or cyanosis Skin - normal coloration and turgor, no rashes  ED Treatments / Results  Labs (all labs ordered are listed, but only abnormal results are displayed) Labs Reviewed  COMPREHENSIVE METABOLIC PANEL - Abnormal;  Notable for the following:       Result Value   Glucose, Bld 113 (*)    AST 52 (*)    ALT 61 (*)    All other components within normal limits  URINALYSIS, ROUTINE W REFLEX MICROSCOPIC (NOT AT [facility]) - Abnormal; Notable for the following:    APPearance HAZY (*)    Leukocytes, UA MODERATE (*)    All other components within normal limits  URINE MICROSCOPIC-ADD ON - Abnormal; Notable for the following:    Squamous Epithelial / LPF 6-30 (*)    Bacteria, UA FEW (*)    All other components within normal limits  URINE CULTURE  LIPASE, BLOOD  CBC  I-STAT BETA HCG BLOOD, ED ([facility], [facility], [facility] ONLY)    EKG  EKG Interpretation None       Radiology US Abdomen Limited  Result Date: 01/01/2016 CLINICAL DATA:  Epigastric pain EXAM: US ABDOMEN LIMITED - RIGHT UPPER QUADRANT COMPARISON:  CT scan 10/29/2015 FINDINGS: Gallbladder: No gallstones are noted within gallbladder. No sonographic Murphy's sign. Common bile duct: Diameter: 6 mm in diameter mild prominent in size. Liver: No focal hepatic mass. There is diffuse increased echogenicity of the liver suspicious for fatty infiltration. IMPRESSION: No gallstones are noted within gallbladder. No sonographic Murphy's sign. Mild prominent size CBD measures 6 mm in diameter. Diffuse increased echogenicity of the liver consistent with fatty infiltration. Electronically Signed   By: Natasha Mead M.D.   On: 01/01/2016 11:44    Procedures Procedures (including critical care time)  Medications Ordered in ED Medications  ondansetron (ZOFRAN-ODT) 4 MG disintegrating tablet (not administered)  oxyCODONE-acetaminophen (PERCOCET/ROXICET) 5-325 MG per tablet 1 tablet (1 tablet Oral Not Given 01/01/16 1205)  ondansetron (ZOFRAN-ODT) disintegrating tablet 4 mg (4 mg Oral Given 01/01/16 0916)  gi cocktail (Maalox,Lidocaine,Donnatal) (30 mLs Oral Given 01/01/16 1610)     Initial Impression / Assessment and Plan / ED Course  I have reviewed the triage vital signs and the  nursing notes.  Pertinent labs & imaging results that were available during my care of the patient were reviewed by me and considered in my medical decision making (see chart for details).  Clinical Course   per prior chart review patient has had elevated LFTs and hepatic steatosis on CT scan earlier this fall.  Acute hepatitis panel is negative.  Will obtain abdominal US today to ensure no gallstones or choleycstitis- pain may be due to hepatic capsule.  Pt has been referred to GI, will encourage her to f/u with GI.   11:57 AM ultrasound is c/w known hepatic steatosis.  Will give another dose of pain meds.  Pt states she was unable to get appointment with GI due to a problem with her medicaid- I have consulted case management to see if they can assist her.  Will do a po trial  as well.    Talked with case manager she will give patient the contact information for medicaid.  May need to finish her application.     Final Clinical Impressions(s) / ED Diagnoses   Final diagnoses:  Hepatic steatosis  Pain of upper abdomen    New Prescriptions Discharge Medication List as of 01/01/2016  1:03 PM       Jerelyn ScottMartha Linker, MD 01/01/16 1329

## 2016-01-01 NOTE — Progress Notes (Signed)
Entered in d/c  Medicaid services     [place]: [phone] [address] [place], [place] [id number] [email or web address].ncdhhs.gov/ Use this website to assist with finding a Medicaid doctor, understanding your coverage & to renew application    Next Steps: Follow up on 01/01/2016    Instructions: Medicaid services generally has yearly renewal Please contact DSS with above information or go the website above Medicaid approval takes minimal 30-45 days from the time ALL information is provided to complete the application    A referral for you has been sent to [facility] if you have not received a call in 3 days you may contact them Call Scherry RanKaren Andrianos at [phone] Tuesday-Friday [email or web address]     If immediate follow up services is needed Please call one of the doctors listed on the first two pages Please use the resources provided to you in emergency room by case manager to assist you're your choice of doctor for follow up     Next Steps: Follow up    Instructions: These [place] uninsured resources provide possible primary care providers, resources for discounted medications, housing, dental resources, affordable care act information, plus other resources for [place]      Spoke with Tobi Bastosnna RN about giving pt uninsured resource before [facility]

## 2016-01-01 NOTE — Progress Notes (Signed)
ED Cm has Unit clerk to call pt room so CM could speak with her Pt did not answer the phone in her room and Tobi Bastosnna nurse unable to assist with Cm speaking with pt

## 2016-01-01 NOTE — Progress Notes (Addendum)
   01/01/16 0000  CM Assessment  Expected Discharge Plan Home/Self Care  In-house Referral NA  Discharge Planning Services CM Consult  [facility]  Choice offered to / list presented to  Patient  Status of Service Completed, signed off  Discharge Disposition Home/Self Care   ED CM consult to CM No reason listed ED CM spoke with EDP Linker who voiced concern abou tpt being seen in September 2017 and not following up with a specialist recommended. It is not an emergency that she needs to see this specialist per EDP but benefical to pt Cm discussed not having an uninsured specialist for pt but could offer [facility] information  Pt informed EDP she is waiting or working on her medicaid Cm discussed pt would need to go to DSS to get assist with medicaid and could provide contact information in her d/c instructions  ED Registration in EPIC confirms pt is not covered by medicaid on 01/01/16 Cm and EDP discussed possibly why pt does not have medicaid- pt did not complete renewal application, 30-45 days for approval if approved  CM faxed [phone]) uninsured [place] resources to the nurse of pt to ask resources be given to pt prior to d/c

## 2016-01-01 NOTE — ED Triage Notes (Addendum)
Pt. Reports having upper abdominal pain, sharp. Began last night and this morning she started having n/v  She denies any vaginal bleeding or discharge.  She is having dysuria , denies any hematuria Is having urgency and frequency.  Pt. Denies any sob or chest pain.

## 2016-01-01 NOTE — Discharge Instructions (Signed)
Return to the ED with any concerns including vomiting and not able to keep down liquids, worsening pain, fainting, decreased level of alertness/lethargy, or any other alarming symptoms °

## 2016-01-02 LAB — URINE CULTURE

## 2016-02-07 ENCOUNTER — Emergency Department (HOSPITAL_COMMUNITY)
Admission: EM | Admit: 2016-02-07 | Discharge: 2016-02-07 | Disposition: A | Discharge status: Home / Self Care | Payer: Self-pay | Attending: Emergency Medicine | Admitting: Emergency Medicine

## 2016-02-07 ENCOUNTER — Emergency Department (HOSPITAL_COMMUNITY): Payer: Self-pay

## 2016-02-07 ENCOUNTER — Encounter (HOSPITAL_COMMUNITY): Payer: Self-pay

## 2016-02-07 DIAGNOSIS — M25511 Pain in right shoulder: Secondary | ICD-10-CM | POA: Insufficient documentation

## 2016-02-07 DIAGNOSIS — I10 Essential (primary) hypertension: Secondary | ICD-10-CM | POA: Insufficient documentation

## 2016-02-07 MED ORDER — IBUPROFEN 600 MG PO TABS: 600.0000 mg | ORAL_TABLET | Freq: Four times a day (QID) | ORAL | 0 refills | Status: DC | PRN

## 2016-02-07 MED ORDER — IBUPROFEN 400 MG PO TABS
600.0000 mg | ORAL_TABLET | Freq: Once | ORAL | Status: AC
  Administered 2016-02-07: 600 mg via ORAL
  Filled 2016-02-07: qty 1

## 2016-02-07 NOTE — ED Provider Notes (Signed)
[facility]-EMERGENCY DEPT Provider Note   CSN: [id number] Arrival date & time: 02/07/16  1557  By signing my name below, I, Javier Docker, attest that this documentation has been prepared under the direction and in the presence of Audry Pili, PA-C. Electronically Signed: Javier Docker, ER Scribe. 09/12/2015. 4:54 PM.  History   Chief Complaint Chief Complaint  Patient presents with  . Shoulder Pain   The history is provided by the patient. No language interpreter was used.  Shoulder Pain      HPI Comments: Gina [name] is a 36 y.o. female who presents to the Emergency Department complaining of three weeks of right shoulder pain that radiates down her arm. She denies MOI. She describes the pain as a sharp pain with movement. She has no pain at rest. She has taken aspirin and topical icyhot without significant relief. No fevers. No decrease in sensation. No other symptoms noted.   Past Medical History:  Diagnosis Date  . Herpes genitalia   . Hypertension   . Migraines     Patient Active Problem List   Diagnosis Date Noted  . Migraine headache 04/15/2015  . Generalized anxiety disorder 02/06/2015  . Cephalalgia 01/05/2015  . Genital herpes 01/05/2015  . Seasonal allergies 01/05/2015  . Obesity 01/05/2015  . History of hypertension 01/05/2015    Past Surgical History:  Procedure Laterality Date  . TUBAL LIGATION      OB History    No data available       Home Medications    Prior to Admission medications   Medication Sig Start Date End Date Taking? Authorizing Provider  HYDROcodone-acetaminophen (NORCO/VICODIN) 5-325 MG tablet Take 2 tablets by mouth every 4 (four) hours as needed. 12/31/15   Lorre Nick, MD  ibuprofen (ADVIL,MOTRIN) 200 MG tablet Take 400 mg by mouth every 6 (six) hours as needed for moderate pain.    Historical Provider, MD  methocarbamol (ROBAXIN-750) 750 MG tablet Take 1 tablet (750 mg total) by mouth 4 (four) times daily. 12/31/15    Lorre Nick, MD  ondansetron (ZOFRAN ODT) 4 MG disintegrating tablet Take 1 tablet (4 mg total) by mouth every 8 (eight) hours as needed for nausea or vomiting. 01/01/16   Jerelyn Scott, MD  SUMAtriptan (IMITREX) 20 MG/ACT nasal spray 1 spray in one nostril.  May repeat once in 2 hours if headache persists or recurs. Patient not taking: Reported on 11/15/2015 08/21/15   Henrietta Hoover, NP  traMADol (ULTRAM) 50 MG tablet Take 1 tablet (50 mg total) by mouth every 12 (twelve) hours as needed. 01/01/16   Jerelyn Scott, MD    Family History Family History  Problem Relation Age of Onset  . Diabetes Mother     Social History Social History  Substance Use Topics  . Smoking status: Never Smoker  . Smokeless tobacco: Never Used  . Alcohol use No     Allergies   Patient has no known allergies.   Review of Systems Review of Systems  Constitutional: Negative for chills and fever.  Musculoskeletal: Negative for joint swelling.  Skin: Negative for color change, rash and wound.  Neurological: Negative for weakness.   A complete 10 system review of systems was obtained and all systems are negative except as noted in the HPI and PMH.    Physical Exam Updated Vital Signs BP 118/86   Pulse 86   Temp 98.7 F (37.1 C) (Oral)   Resp 16   LMP 01/17/2016 (Approximate)   SpO2  100%   Physical Exam  Constitutional: She is oriented to person, place, and time. She appears well-developed and well-nourished. No distress.  HENT:  Head: Normocephalic and atraumatic.  Eyes: Pupils are equal, round, and reactive to light.  Neck: Neck supple.  Cardiovascular: Normal rate.   Pulmonary/Chest: Effort normal. No respiratory distress.  Musculoskeletal: Normal range of motion.  Right Shoulder Negative hawkins test, negative Neer's test, no TTP over shoulder or elbow.Pain with internal and external rotation. No obvious bony deformity. NVI. Distal pulses appreciated.   Neurological: She is alert and  oriented to person, place, and time. Coordination normal.  Skin: Skin is warm and dry. She is not diaphoretic.  Psychiatric: She has a normal mood and affect. Her behavior is normal.  Nursing note and vitals reviewed.   ED Treatments / Results  DIAGNOSTIC STUDIES: Oxygen Saturation is 100% on RA, normal by my interpretation.    COORDINATION OF CARE: 4:54 PM Discussed treatment plan with pt at bedside and pt agreed to plan.  Labs (all labs ordered are listed, but only abnormal results are displayed) Labs Reviewed - No data to display  EKG  EKG Interpretation None       Radiology Dg Shoulder Right  Result Date: 02/07/2016 CLINICAL DATA:  Shoulder pain for 2 weeks with no known trauma. EXAM: RIGHT SHOULDER - 2+ VIEW COMPARISON:  None. FINDINGS: There is no evidence of fracture or dislocation. There is no evidence of arthropathy or other focal bone abnormality. Soft tissues are unremarkable. IMPRESSION: Negative. Electronically Signed   By: Gerome Samavid  Williams III M.D   On: 02/07/2016 17:22    Procedures Procedures (including critical care time)  Medications Ordered in ED Medications - No data to display   Initial Impression / Assessment and Plan / ED Course  I have reviewed the triage vital signs and the nursing notes.  Pertinent labs & imaging results that were available during my care of the patient were reviewed by me and considered in my medical decision making (see chart for details).  Clinical Course     Final Clinical Impressions(s) / ED Diagnoses   {I have reviewed and evaluated the relevant imaging studies. {I have reviewed the relevant previous healthcare records.  {I obtained HPI from historian.   ED Course:  Assessment: Patient X-Ray negative for obvious fracture or dislocation.  Rotator cuff vs impingement. Pt advised to follow up with orthopedics. Patient given sling while in ED, conservative therapy recommended and discussed. Patient will be discharged home &  is agreeable with above plan. Returns precautions discussed. Pt appears safe for discharge.  Disposition/Plan:  DC Home Additional Verbal discharge instructions given and discussed with patient.  Pt Instructed to f/u with Ortho in the next week for evaluation and treatment of symptoms. Return precautions given Pt acknowledges and agrees with plan  Supervising Physician Loren Raceravid Yelverton, MD  Final diagnoses:  Acute pain of right shoulder    New Prescriptions New Prescriptions   No medications on file    I personally performed the services described in this documentation, which was scribed in my presence. The recorded information has been reviewed and is accurate.        Audry Piliyler Beatryce Colombo, PA-C 02/07/16 1731    Loren Raceravid Yelverton, MD 02/10/16 1245

## 2016-02-07 NOTE — ED Notes (Signed)
Declined W/C at D/C and was escorted to lobby by RN. 

## 2016-02-07 NOTE — ED Triage Notes (Signed)
Pt complaining of R shoulder pain. Pt denies any obvious injury or trauma. Pt with marked decreased ROM. Good radial pulse, cap refill < 3s.

## 2016-02-07 NOTE — Discharge Instructions (Signed)
Please read and follow all provided instructions.  Your diagnoses today include:  1. Acute pain of right shoulder     Tests performed today include: Vital signs. See below for your results today.   Medications prescribed:  Take as prescribed   Home care instructions:  Follow any educational materials contained in this packet.  Follow-up instructions: Please follow-up with Orthopedics for further evaluation of symptoms and treatment   Return instructions:  Please return to the Emergency Department if you do not get better, if you get worse, or new symptoms OR  - Fever (temperature greater than 101.6F)  - Bleeding that does not stop with holding pressure to the area    -Severe pain (please note that you may be more sore the day after your accident)  - Chest Pain  - Difficulty breathing  - Severe nausea or vomiting  - Inability to tolerate food and liquids  - Passing out  - Skin becoming red around your wounds  - Change in mental status (confusion or lethargy)  - New numbness or weakness    Please return if you have any other emergent concerns.  Additional Information:  Your vital signs today were: BP 118/86    Pulse 86    Temp 98.7 F (37.1 C) (Oral)    Resp 16    LMP 01/17/2016 (Approximate)    SpO2 100%  If your blood pressure (BP) was elevated above 135/85 this visit, please have this repeated by your doctor within one month. ---------------

## 2016-02-08 MED FILL — ?IBUPROFEN 600 MG TABLET: 600 MG | 8 days supply | Qty: 30 | Fill #0

## 2017-04-22 ENCOUNTER — Emergency Department (HOSPITAL_COMMUNITY)
Admission: EM | Admit: 2017-04-22 | Discharge: 2017-04-22 | Disposition: A | Discharge status: Home / Self Care | Payer: Self-pay | Attending: Emergency Medicine | Admitting: Emergency Medicine

## 2017-04-22 ENCOUNTER — Other Ambulatory Visit: Payer: Self-pay

## 2017-04-22 ENCOUNTER — Encounter (HOSPITAL_COMMUNITY): Payer: Self-pay

## 2017-04-22 DIAGNOSIS — I10 Essential (primary) hypertension: Secondary | ICD-10-CM | POA: Insufficient documentation

## 2017-04-22 DIAGNOSIS — R21 Rash and other nonspecific skin eruption: Secondary | ICD-10-CM | POA: Insufficient documentation

## 2017-04-22 DIAGNOSIS — Z79899 Other long term (current) drug therapy: Secondary | ICD-10-CM | POA: Insufficient documentation

## 2017-04-22 MED ORDER — DEXAMETHASONE SODIUM PHOSPHATE 10 MG/ML IJ SOLN
10.0000 mg | Freq: Once | INTRAMUSCULAR | Status: AC
  Administered 2017-04-22: 10 mg via INTRAMUSCULAR
  Filled 2017-04-22: qty 1

## 2017-04-22 MED ORDER — DIPHENHYDRAMINE HCL 25 MG PO CAPS
50.0000 mg | ORAL_CAPSULE | Freq: Once | ORAL | Status: AC
  Administered 2017-04-22: 50 mg via ORAL
  Filled 2017-04-22: qty 2

## 2017-04-22 NOTE — Discharge Instructions (Signed)
Continue taking Benadryl tablets as needed.  You may take 50 mg at a time, up to 4 times a day.   Make sure you stay well-hydrated while taking this medicine, as it can make you thirsty. Follow-up with [place] as needed to establish primary care and for possible investigation into your allergies. Return to the emergency room immediately if you develop difficulty breathing, difficulty swallowing, tongue/lip/throat swelling.  Return if you have any other new or concerning symptoms.

## 2017-04-22 NOTE — ED Triage Notes (Signed)
Pt states she ate something at work that didn't settle well with her, states she has been itching and now has hives present to her right hip.  Denies any throat swelling or difficulty breathing.

## 2017-04-22 NOTE — ED Provider Notes (Signed)
[facility] Provider Note   CSN: [id number] Arrival date & time: 04/22/17  1839     History   Chief Complaint Chief Complaint  Patient presents with  . Rash    HPI Gina [name] is a 37 y.o. female presenting for evaluation of rash.  Pt states that yesterday she developed itching and redness of the skin.  This is present on her left hip and right inner upper arm.  She thinks this might be a reaction to something she ate at work.  She has no history of allergies including to foods or medications.  She denies eating something new, does not know what she might be allergic to.  She denies chest pain, shortness of breath, difficulty breathing, or difficulty swallowing.  The symptoms have persisted despite topical Benadryl.  Nothing has made them better.  Heat makes it worse.  She denies rash elsewhere.  She denies change in environment, soaps, shampoos, detergents.  No one else at home has similar rash.  No history of similar.  She denies fevers, chills, or recent URI symptoms.  HPI  Past Medical History:  Diagnosis Date  . Herpes genitalia   . Hypertension   . Migraines     Patient Active Problem List   Diagnosis Date Noted  . Migraine headache 04/15/2015  . Generalized anxiety disorder 02/06/2015  . Cephalalgia 01/05/2015  . Genital herpes 01/05/2015  . Seasonal allergies 01/05/2015  . Obesity 01/05/2015  . History of hypertension 01/05/2015    Past Surgical History:  Procedure Laterality Date  . TUBAL LIGATION       OB History   None      Home Medications    Prior to Admission medications   Medication Sig Start Date End Date Taking? Authorizing Provider  HYDROcodone-acetaminophen (NORCO/VICODIN) 5-325 MG tablet Take 2 tablets by mouth every 4 (four) hours as needed. 12/31/15   Lorre NickAllen, Anthony, MD  ibuprofen (ADVIL,MOTRIN) 600 MG tablet Take 1 tablet (600 mg total) by mouth every 6 (six) hours as needed. 02/07/16   Audry PiliMohr,  Tyler, PA-C  methocarbamol (ROBAXIN-750) 750 MG tablet Take 1 tablet (750 mg total) by mouth 4 (four) times daily. 12/31/15   Lorre NickAllen, Anthony, MD  ondansetron (ZOFRAN ODT) 4 MG disintegrating tablet Take 1 tablet (4 mg total) by mouth every 8 (eight) hours as needed for nausea or vomiting. 01/01/16   Mabe, Latanya MaudlinMartha L, MD  SUMAtriptan (IMITREX) 20 MG/ACT nasal spray 1 spray in one nostril.  May repeat once in 2 hours if headache persists or recurs. Patient not taking: Reported on 11/15/2015 08/21/15   Henrietta HooverBernhardt, Linda C, NP  traMADol (ULTRAM) 50 MG tablet Take 1 tablet (50 mg total) by mouth every 12 (twelve) hours as needed. 01/01/16   Mabe, Latanya MaudlinMartha L, MD    Family History Family History  Problem Relation Age of Onset  . Diabetes Mother     Social History Social History   Tobacco Use  . Smoking status: Never Smoker  . Smokeless tobacco: Never Used  Substance Use Topics  . Alcohol use: No  . Drug use: No     Allergies   Patient has no known allergies.   Review of Systems Review of Systems  Constitutional: Negative for chills and fever.  Respiratory: Negative for cough, chest tightness, shortness of breath, wheezing and stridor.   Cardiovascular: Negative for chest pain.  Gastrointestinal: Negative for nausea and vomiting.  Skin: Positive for rash.     Physical Exam  Updated Vital Signs BP (!) 151/96 (BP Location: Right Arm)   Pulse 98   Temp 98.4 F (36.9 C) (Oral)   Resp 15   Ht 5\' 7"  (1.702 m)   Wt 84.8 kg (187 lb)   SpO2 100%   BMI 29.29 kg/m   Physical Exam  Constitutional: She is oriented to person, place, and time. She appears well-developed and well-nourished. No distress.  HENT:  Head: Normocephalic and atraumatic.  Mouth/Throat: Uvula is midline, oropharynx is clear and moist and mucous membranes are normal.  No erythema.  No swelling of the lips, tongue, or throat.  Handling secretions easily.  No sign of respiratory distress.  Eyes: EOM are normal.  Neck:  Normal range of motion.  Cardiovascular: Normal rate, regular rhythm and intact distal pulses.  Pulmonary/Chest: Effort normal and breath sounds normal. No stridor. No respiratory distress. She has no wheezes.  Abdominal: She exhibits no distension.  Musculoskeletal: Normal range of motion.  Neurological: She is alert and oriented to person, place, and time.  Skin: Skin is warm. Rash noted.  Warmth and erythema of the right inner upper arm.  Warm erythematous plaques of the left lateral hip and upper leg.  No lesions noted elsewhere.  Rashes are pruritic and nonpainful.  No vesicles or pustules.  Psychiatric: She has a normal mood and affect.  Nursing note and vitals reviewed.    ED Treatments / Results  Labs (all labs ordered are listed, but only abnormal results are displayed) Labs Reviewed - No data to display  EKG None  Radiology No results found.  Procedures Procedures (including critical care time)  Medications Ordered in ED Medications  diphenhydrAMINE (BENADRYL) capsule 50 mg (50 mg Oral Given 04/22/17 1938)  dexamethasone (DECADRON) injection 10 mg (10 mg Intramuscular Given 04/22/17 1938)     Initial Impression / Assessment and Plan / ED Course  I have reviewed the triage vital signs and the nursing notes.  Pertinent labs & imaging results that were available during my care of the patient were reviewed by me and considered in my medical decision making (see chart for details).     Patient presenting for evaluation of rash.  Physical exam exam shows patient who is afebrile, not tachycardic, and in no respiratory distress.  Irritation of the right inner upper arm and left lateral hip.  No symptoms around the neck or mouth.  Handling secretions easily. Will treat with Decadron shot and Benadryl.  Follow-up with primary care as needed.  Strict return precautions given.  At this time, patient appears safe for discharge.  Patient states she understands and agrees to  plan.   Final Clinical Impressions(s) / ED Diagnoses   Final diagnoses:  Rash    ED Discharge Orders    None       Alveria Apley, PA-C 04/22/17 2030    Rolan Bucco, MD 04/22/17 2315

## 2017-07-29 ENCOUNTER — Other Ambulatory Visit: Payer: Self-pay

## 2017-07-29 ENCOUNTER — Emergency Department (HOSPITAL_COMMUNITY)
Admission: EM | Admit: 2017-07-29 | Discharge: 2017-07-29 | Disposition: A | Discharge status: Home / Self Care | Payer: Self-pay | Attending: Emergency Medicine | Admitting: Emergency Medicine

## 2017-07-29 ENCOUNTER — Encounter (HOSPITAL_COMMUNITY): Payer: Self-pay | Admitting: Emergency Medicine

## 2017-07-29 DIAGNOSIS — I10 Essential (primary) hypertension: Secondary | ICD-10-CM | POA: Insufficient documentation

## 2017-07-29 DIAGNOSIS — G43909 Migraine, unspecified, not intractable, without status migrainosus: Secondary | ICD-10-CM | POA: Insufficient documentation

## 2017-07-29 MED ORDER — KETOROLAC TROMETHAMINE 30 MG/ML IJ SOLN
30.0000 mg | Freq: Once | INTRAMUSCULAR | Status: AC
  Administered 2017-07-29: 30 mg via INTRAVENOUS
  Filled 2017-07-29: qty 1

## 2017-07-29 MED ORDER — SODIUM CHLORIDE 0.9 % IV BOLUS
1000.0000 mL | Freq: Once | INTRAVENOUS | Status: AC
  Administered 2017-07-29: 1000 mL via INTRAVENOUS

## 2017-07-29 MED ORDER — METOCLOPRAMIDE HCL 5 MG/ML IJ SOLN
10.0000 mg | Freq: Once | INTRAMUSCULAR | Status: AC
  Administered 2017-07-29: 10 mg via INTRAVENOUS
  Filled 2017-07-29: qty 2

## 2017-07-29 NOTE — ED Provider Notes (Signed)
[facility]-EMERGENCY DEPT Provider Note   CSN: [id number] Arrival date & time: 07/29/17  0549     History   Chief Complaint Chief Complaint  Patient presents with  . Headache  . Nausea    HPI Gina [name] is a 37 y.o. female.  37 year old female with history of migraine headaches complaining of a migraine headache.  She says she started with a right temporal throbbing severe headache gradual in onset since 5 PM last night that came and went initially and then was more progressive overnight.  It was associated with photophobia nausea and vomiting.  There is been no fever or chills.  No chest pain abdominal pain numbness or weakness.  Headache is typical of her migraines.  She just finished her period and this is often 1 of the triggers to get her headaches.  She tried some Excedrin migraine without any relief.  No trauma.  The history is provided by the patient.  Migraine  This is a recurrent problem. The current episode started 6 to 12 hours ago. The problem occurs constantly. The problem has not changed since onset.Associated symptoms include headaches. Pertinent negatives include no chest pain, no abdominal pain and no shortness of breath. Exacerbated by: light. Nothing relieves the symptoms. She has tried acetaminophen for the symptoms. The treatment provided no relief.    Past Medical History:  Diagnosis Date  . Herpes genitalia   . Hypertension   . Migraines     Patient Active Problem List   Diagnosis Date Noted  . Migraine headache 04/15/2015  . Generalized anxiety disorder 02/06/2015  . Cephalalgia 01/05/2015  . Genital herpes 01/05/2015  . Seasonal allergies 01/05/2015  . Obesity 01/05/2015  . History of hypertension 01/05/2015    Past Surgical History:  Procedure Laterality Date  . TUBAL LIGATION       OB History   None      Home Medications    Prior to Admission medications   Medication Sig Start Date End Date Taking?  Authorizing Provider  aspirin-acetaminophen-caffeine (EXCEDRIN MIGRAINE) [phone] MG tablet Take 3 tablets by mouth at bedtime as needed for migraine.   Yes [provider]  HYDROcodone-acetaminophen (NORCO/VICODIN) 5-325 MG tablet Take 2 tablets by mouth every 4 (four) hours as needed. Patient not taking: Reported on 07/29/2017 12/31/15   Lorre NickAllen, Anthony, MD  ibuprofen (ADVIL,MOTRIN) 600 MG tablet Take 1 tablet (600 mg total) by mouth every 6 (six) hours as needed. Patient not taking: Reported on 07/29/2017 02/07/16   Audry PiliMohr, Tyler, PA-C  methocarbamol (ROBAXIN-750) 750 MG tablet Take 1 tablet (750 mg total) by mouth 4 (four) times daily. Patient not taking: Reported on 07/29/2017 12/31/15   Lorre NickAllen, Anthony, MD  ondansetron (ZOFRAN ODT) 4 MG disintegrating tablet Take 1 tablet (4 mg total) by mouth every 8 (eight) hours as needed for nausea or vomiting. Patient not taking: Reported on 07/29/2017 01/01/16   Phillis HaggisMabe, Martha L, MD  SUMAtriptan [facility](IMITREX) 20 MG/ACT nasal spray 1 spray in one nostril.  May repeat once in 2 hours if headache persists or recurs. Patient not taking: Reported on 07/29/2017 08/21/15   Henrietta HooverBernhardt, Linda C, NP  traMADol (ULTRAM) 50 MG tablet Take 1 tablet (50 mg total) by mouth every 12 (twelve) hours as needed. Patient not taking: Reported on 07/29/2017 01/01/16   Phillis HaggisMabe, Martha L, MD    Family History Family History  Problem Relation Age of Onset  . Diabetes Mother     Social History Social History  Tobacco Use  . Smoking status: Never Smoker  . Smokeless tobacco: Never Used  Substance Use Topics  . Alcohol use: No  . Drug use: No     Allergies   Patient has no known allergies.   Review of Systems Review of Systems  Constitutional: Negative for fever.  HENT: Negative for sore throat.   Eyes: Positive for photophobia.  Respiratory: Negative for shortness of breath.   Cardiovascular: Negative for chest pain.  Gastrointestinal: Positive for nausea and  vomiting. Negative for abdominal pain.  Genitourinary: Negative for dysuria.  Musculoskeletal: Negative for neck pain.  Skin: Negative for rash.  Neurological: Positive for headaches.     Physical Exam Updated Vital Signs BP (!) 137/93 (BP Location: Left Arm)   Pulse 64   Temp 97.7 F (36.5 C) (Oral)   Resp 16   Ht 5\' 9"  (1.753 m)   Wt 84.8 kg (187 lb)   SpO2 100%   BMI 27.62 kg/m   Physical Exam  Constitutional: She is oriented to person, place, and time. She appears well-developed and well-nourished. No distress.  HENT:  Head: Normocephalic and atraumatic.  Eyes: Pupils are equal, round, and reactive to light. Conjunctivae and EOM are normal.  Neck: Neck supple.  Cardiovascular: Normal rate and regular rhythm.  No murmur heard. Pulmonary/Chest: Effort normal and breath sounds normal. No respiratory distress.  Abdominal: Soft. There is no tenderness.  Musculoskeletal: She exhibits no edema.  Neurological: She is alert and oriented to person, place, and time. She has normal strength. GCS eye subscore is 4. GCS verbal subscore is 5. GCS motor subscore is 6.  Skin: Skin is warm and dry.  Psychiatric: She has a normal mood and affect.  Nursing note and vitals reviewed.    ED Treatments / Results  Labs (all labs ordered are listed, but only abnormal results are displayed) Labs Reviewed - No data to display  EKG None  Radiology No results found.  Procedures Procedures (including critical care time)  Medications Ordered in ED Medications  metoCLOPramide (REGLAN) injection 10 mg (has no administration in time range)  ketorolac (TORADOL) 30 MG/ML injection 30 mg (has no administration in time range)  sodium chloride 0.9 % bolus 1,000 mL (has no administration in time range)     Initial Impression / Assessment and Plan / ED Course  I have reviewed the triage vital signs and the nursing notes.  Pertinent labs & imaging results that were available during my care  of the patient were reviewed by me and considered in my medical decision making (see chart for details).  Clinical Course as of Jul 29 1537  Sat Jul 29, 2017  0931 Patient with history of migraines complaining of right-sided headache typical of her migraines.  She has a nonfocal neuro exam no other objective signs of systemic illness.  She received some Toradol and Reglan states her headache is much improved.  She feels comfortable going home and I think is reasonable.  She understands to go home and stay well-hydrated and try to get some rest.   [MB]    Clinical Course User Index [MB] Terrilee Files, MD     Final Clinical Impressions(s) / ED Diagnoses   Final diagnoses:  Migraine without status migrainosus, not intractable, unspecified migraine type    ED Discharge Orders    None       Terrilee Files, MD 07/29/17 1540

## 2017-07-29 NOTE — Discharge Instructions (Addendum)
Your evaluated in the emergency department for right-sided headache typical of your migraines.  You had some IV fluids and some medication to help with the headache and you were improved.  Recommend you go home and continue to stay hydrated and try to get some rest.  Please follow-up with your doctor and return if any worsening symptoms.

## 2017-08-23 ENCOUNTER — Telehealth: Payer: Self-pay | Admitting: Internal Medicine

## 2017-08-23 ENCOUNTER — Ambulatory Visit: Payer: Self-pay | Admitting: Internal Medicine

## 2017-08-23 ENCOUNTER — Encounter: Payer: Self-pay | Admitting: Internal Medicine

## 2017-08-23 VITALS — BP 138/98 | HR 57 | Temp 98.0°F | Wt 231.8 lb

## 2017-08-23 DIAGNOSIS — R1032 Left lower quadrant pain: Secondary | ICD-10-CM

## 2017-08-23 DIAGNOSIS — G4700 Insomnia, unspecified: Secondary | ICD-10-CM

## 2017-08-23 DIAGNOSIS — Z841 Family history of disorders of kidney and ureter: Secondary | ICD-10-CM

## 2017-08-23 DIAGNOSIS — Z6834 Body mass index (BMI) 34.0-34.9, adult: Secondary | ICD-10-CM

## 2017-08-23 DIAGNOSIS — Z0000 Encounter for general adult medical examination without abnormal findings: Secondary | ICD-10-CM | POA: Insufficient documentation

## 2017-08-23 DIAGNOSIS — F319 Bipolar disorder, unspecified: Secondary | ICD-10-CM

## 2017-08-23 DIAGNOSIS — E669 Obesity, unspecified: Secondary | ICD-10-CM

## 2017-08-23 DIAGNOSIS — Z79899 Other long term (current) drug therapy: Secondary | ICD-10-CM

## 2017-08-23 DIAGNOSIS — R109 Unspecified abdominal pain: Secondary | ICD-10-CM

## 2017-08-23 DIAGNOSIS — F419 Anxiety disorder, unspecified: Secondary | ICD-10-CM

## 2017-08-23 DIAGNOSIS — R3129 Other microscopic hematuria: Secondary | ICD-10-CM

## 2017-08-23 DIAGNOSIS — D573 Sickle-cell trait: Secondary | ICD-10-CM | POA: Insufficient documentation

## 2017-08-23 DIAGNOSIS — G43909 Migraine, unspecified, not intractable, without status migrainosus: Secondary | ICD-10-CM

## 2017-08-23 LAB — POCT URINALYSIS DIPSTICK
BILIRUBIN UA: NEGATIVE
GLUCOSE UA: NEGATIVE
KETONES UA: NEGATIVE
NITRITE UA: NEGATIVE
Protein, UA: NEGATIVE
Spec Grav, UA: >=1.03 — AB (ref 1.010–1.025)
Urobilinogen, UA: 0.2 E.U./dL
pH, UA: 5.5 (ref 5.0–8.0)

## 2017-08-23 NOTE — Telephone Encounter (Signed)
Spoke with patient about her dip stick results and discussion about confirming the stone with imaging via CT scan. She states she would like to get the CT scan only if her pain does not resolve. Stated I can put a standing order in so she can get it done if the pain does not resolve in time. Also she states that she was on her period until yesterday, which may explain the blood seen on urine.

## 2017-08-23 NOTE — Assessment & Plan Note (Addendum)
-   Patient reports being diagnosed with sickle cell trait in the past - No history of acute exacerbations

## 2017-08-23 NOTE — Addendum Note (Signed)
Addended by: Bufford SpikesFULCHER, Lennie Vasco N on: 08/23/2017 05:02 PM   Modules accepted: Orders

## 2017-08-23 NOTE — Assessment & Plan Note (Signed)
-   Patient with history of BMI ~ 6133 - Had weight loss with BMI down to 27 on 07/2017 - Recent weight gain of 45lbs in the last 4 weeks  - Patient is at risk for developing metabolic syndrome while on atypical antipsychotic (Cariprazine) - Will check BMP, A1C, Lipid panel today

## 2017-08-23 NOTE — Assessment & Plan Note (Signed)
-   Patient believes her last pap smear to be less than 3 years ago but cannot recall results or exact date - Agrees to HPV vaccine  - Counseled pt to contact the health department for possible cheaper vaccination as patient is uninsured

## 2017-08-23 NOTE — Assessment & Plan Note (Signed)
-   Patient w/ acute onset of constant flank pain of 1 week duration - Family history of kidney stones - Hx of taking Robaxin for lower back pain - Physical exam: Pain localized on left flank around lumbar region above the iliac crest  - Left Flank Pain 2/2 nephrolithiasis vs muscle strain - Has risk factors of obesity, family history, and diet high in sodium, protein, and sugar for nephrolithiasis - Dip stick urine show moderate blood  - Counseled pt on pain management with OTC ibuprofen and increasing fluid intake - Return to clinic if pain does not resolve or fail to improve

## 2017-08-23 NOTE — Progress Notes (Signed)
   CC: Flank Pain  HPI: Ms.Gina [name] is a 37 y.o.F w/ PMH of migraines and bipolar disorder presenting to the clinic to establish care with the clinic. She states she does not work and has never had insurance so she did not want to visit medical providers until recently after being connected with [facility] for her bipolar disorder. She was started on hydroxyzine 25mg  daily and Cariprazine 3mg  daily at that visit and have been gaining weight since starting those medications. She also reports excessive daytime drowsiness. She states she does not remember every being checked for diabetes. She does remember having a pap smear less than 3 years ago but cannot recall the results or the exact date. She states for her chronic migraines she takes Excedrin about 6 tablets a day when she has her episodes but recently bought her dark curtains to reduce the severity of symptoms  Her acute complaint today is a new onset flank pain that began 1 week ago in her left lower back. She states pain began while she was sitting at home with no inciting event or trauma. Pain is described as stabbing 8/10 pain localized on her left flank around level of L3~L5. Patient states she has family history of kidney stones and she has been trying to manage the pain with Excedrin but with little relief. Denies any F/N/V/D/C. Denies any dysuria/frequency/urgency/frank hematuria.  Past Medical History:  Diagnosis Date  . Herpes genitalia   . Hypertension   . Migraines    Social History: Denies Tobacco/Alcohl abuse Denies IVDU Does not work. Currently living with girlfriend and 3 children  Family History: Significant for kidney stones in father Diabetes Mellitus in multiple family members  Review of Systems: Review of Systems  Constitutional: Negative for chills, fever and weight loss.       + Weight gain  Respiratory: Negative for cough, sputum production and shortness of breath.   Cardiovascular:  Negative for chest pain, palpitations and orthopnea.  Genitourinary: Positive for flank pain. Negative for dysuria, frequency, hematuria and urgency.  Neurological: Negative for tingling, sensory change, weakness and headaches.  Psychiatric/Behavioral: Positive for depression. Negative for hallucinations, substance abuse and suicidal ideas. The patient is nervous/anxious and has insomnia.      Physical Exam: Vitals:   08/23/17 0914 08/23/17 0938  BP: (!) 133/97 (!) 138/98  Pulse: (!) 57   Temp: 98 F (36.7 C)   TempSrc: Oral   SpO2: 100%   Weight: 231 lb 12.8 oz (105.1 kg)    Physical Exam  Constitutional: She appears well-developed and well-nourished. No distress.  Cardiovascular: Normal rate, regular rhythm, normal heart sounds and intact distal pulses.  Respiratory: Effort normal and breath sounds normal. No respiratory distress. She has no wheezes.  GI: Soft. Bowel sounds are normal. She exhibits no distension. There is no tenderness.  Tenderness to deep palpation on left lower quadrant  Genitourinary:  Genitourinary Comments: Tenderness to palpation on left flank above her illiac crest. No ecchymosis, edema, or warmth. No CVA tenderness  Musculoskeletal: Normal range of motion. She exhibits no edema or deformity.  Psychiatric: She has a normal mood and affect. Her behavior is normal. Judgment and thought content normal.    Assessment & Plan:   See Encounters Tab for problem based charting.  Patient seen with Dr. Oswaldo DoneVincent   -Gina [name], PGY1

## 2017-08-23 NOTE — Progress Notes (Signed)
Internal Medicine Clinic Attending  I saw and evaluated the patient.  I personally confirmed the key portions of the history and exam documented by Dr. Nedra HaiLee and I reviewed pertinent patient test results.  The assessment, diagnosis, and plan were formulated together and I agree with the documentation in the resident's note.  New left flank/lumbar pain, reassuring exam without distress. Urine dip has moderate microscopic hematuria, we will confirm with urinalysis and microscopy. Intermediate risk here for nephrolithiasis given family history. We will talk with patient about options of CT imaging vs. Supportive care.

## 2017-08-23 NOTE — Patient Instructions (Signed)
Gina [name]  Thank you for establishing care with us we are glad to see you at the office. Please take your NSAIDs for your flank pain and keep from heavy activity. Please start taking your anxiety medication at night as it will make you very drowsy. Thank you for visiting.    Back Pain, Adult Back pain is very common. The pain often gets better over time. The cause of back pain is usually not dangerous. Most people can learn to manage their back pain on their own. Follow these instructions at home: Watch your back pain for any changes. The following actions may help to lessen any pain you are feeling:  Stay active. Start with short walks on flat ground if you can. Try to walk farther each day.  Exercise regularly as told by your doctor. Exercise helps your back heal faster. It also helps avoid future injury by keeping your muscles strong and flexible.  Do not sit, drive, or stand in one place for more than 30 minutes.  Do not stay in bed. Resting more than 1-2 days can slow down your recovery.  Be careful when you bend or lift an object. Use good form when lifting: ? Bend at your knees. ? Keep the object close to your body. ? Do not twist.  Sleep on a firm mattress. Lie on your side, and bend your knees. If you lie on your back, put a pillow under your knees.  Take medicines only as told by your doctor.  Put ice on the injured area. ? Put ice in a plastic bag. ? Place a towel between your skin and the bag. ? Leave the ice on for 20 minutes, 2-3 times a day for the first 2-3 days. After that, you can switch between ice and heat packs.  Avoid feeling anxious or stressed. Find good ways to deal with stress, such as exercise.  Maintain a healthy weight. Extra weight puts stress on your back.  Contact a doctor if:  You have pain that does not go away with rest or medicine.  You have worsening pain that goes down into your legs or buttocks.  You have pain that does not get better  in one week.  You have pain at night.  You lose weight.  You have a fever or chills. Get help right away if:  You cannot control when you poop (bowel movement) or pee (urinate).  Your arms or legs feel weak.  Your arms or legs lose feeling (numbness).  You feel sick to your stomach (nauseous) or throw up (vomit).  You have belly (abdominal) pain.  You feel like you may pass out (faint). This information is not intended to replace advice given to you by your health care provider. Make sure you discuss any questions you have with your health care provider. Document Released: 07/06/2007 Document Revised: 06/25/2015 Document Reviewed: 05/21/2013 Elsevier Interactive Patient Education  [employer]2018 Elsevier Inc.

## 2017-08-24 LAB — URINALYSIS, ROUTINE W REFLEX MICROSCOPIC
BILIRUBIN UA: NEGATIVE
GLUCOSE, UA: NEGATIVE
Ketones, UA: NEGATIVE
Leukocytes, UA: NEGATIVE
Nitrite, UA: NEGATIVE
PROTEIN UA: NEGATIVE
SPEC GRAV UA: 1.024 (ref 1.005–1.030)
UUROB: 0.2 mg/dL (ref 0.2–1.0)
pH, UA: 5 (ref 5.0–7.5)

## 2017-08-24 LAB — MICROSCOPIC EXAMINATION
Casts: NONE SEEN /lpf
Epithelial Cells (non renal): >10 /hpf — AB (ref 0–10)

## 2017-08-24 LAB — BMP8+ANION GAP
ANION GAP: 14 mmol/L (ref 10.0–18.0)
BUN/Creatinine Ratio: 10 (ref 9–23)
BUN: 8 mg/dL (ref 6–20)
CO2: 24 mmol/L (ref 20–29)
CREATININE: 0.8 mg/dL (ref 0.57–1.00)
Calcium: 9.4 mg/dL (ref 8.7–10.2)
Chloride: 102 mmol/L (ref 96–106)
GFR calc Af Amer: 110 mL/min/{1.73_m2} (ref >59)
GFR, EST NON AFRICAN AMERICAN: 95 mL/min/{1.73_m2} (ref >59)
Glucose: 109 mg/dL — ABNORMAL HIGH (ref 65–99)
Potassium: 4.4 mmol/L (ref 3.5–5.2)
Sodium: 140 mmol/L (ref 134–144)

## 2017-08-24 LAB — LIPID PANEL
Chol/HDL Ratio: 4.9 ratio — ABNORMAL HIGH (ref 0.0–4.4)
Cholesterol, Total: 227 mg/dL — ABNORMAL HIGH (ref 100–199)
HDL: 46 mg/dL (ref >39)
LDL Calculated: 152 mg/dL — ABNORMAL HIGH (ref 0–99)
Triglycerides: 143 mg/dL (ref 0–149)
VLDL Cholesterol Cal: 29 mg/dL (ref 5–40)

## 2017-08-24 LAB — HEMOGLOBIN A1C
ESTIMATED AVERAGE GLUCOSE: 120 mg/dL
Hgb A1c MFr Bld: 5.8 % — ABNORMAL HIGH (ref 4.8–5.6)

## 2017-09-08 ENCOUNTER — Ambulatory Visit (HOSPITAL_COMMUNITY): Admission: RE | Admit: 2017-09-08 | Payer: Self-pay | Source: Ambulatory Visit

## 2017-09-27 ENCOUNTER — Ambulatory Visit: Payer: Self-pay

## 2017-10-18 ENCOUNTER — Ambulatory Visit: Payer: Self-pay

## 2017-10-18 ENCOUNTER — Encounter: Payer: Self-pay | Admitting: *Deleted

## 2017-10-24 ENCOUNTER — Ambulatory Visit (HOSPITAL_COMMUNITY): Admission: RE | Admit: 2017-10-24 | Payer: Self-pay | Source: Ambulatory Visit

## 2017-10-28 ENCOUNTER — Encounter (HOSPITAL_COMMUNITY): Payer: Self-pay

## 2017-10-28 ENCOUNTER — Other Ambulatory Visit: Payer: Self-pay

## 2017-10-28 ENCOUNTER — Ambulatory Visit (HOSPITAL_COMMUNITY)
Admission: EM | Admit: 2017-10-28 | Discharge: 2017-10-28 | Disposition: A | Discharge status: Home / Self Care | Payer: Self-pay | Attending: Family Medicine | Admitting: Family Medicine

## 2017-10-28 DIAGNOSIS — G43009 Migraine without aura, not intractable, without status migrainosus: Secondary | ICD-10-CM

## 2017-10-28 MED ORDER — NAPROXEN 500 MG PO TABS: 500.0000 mg | ORAL_TABLET | Freq: Two times a day (BID) | ORAL | 0 refills | Status: AC

## 2017-10-28 MED ORDER — ONDANSETRON HCL 4 MG PO TABS: 4.0000 mg | ORAL_TABLET | Freq: Four times a day (QID) | ORAL | 0 refills | Status: DC

## 2017-10-28 NOTE — Discharge Instructions (Addendum)
Rest and drink fluids Declines shots Will trial naproxen 500 mg twice daily Zofran prescribed.  Take as directed for nausea and vomiting Follow up with PCP for further evaluation and management Return or go to the ED if you have any new or worsening symptoms such as worsening headache, chest pain, shortness of breath, etc..Marland Kitchen

## 2017-10-28 NOTE — ED Triage Notes (Signed)
Pt states she has been vomiting off and on for 3 days. Pt states she has pain all over.

## 2017-10-28 NOTE — ED Provider Notes (Signed)
[facility]   [id number] 10/28/17 Arrival Time: 1705  [id number]  SUBJECTIVE:  Gina [name] is a 37 y.o. female who complains of migraine x 3 days ago.  Denies a precipitating event, or recent head trauma.  Patient localizes her pain to the right temple.  Describes the pain as intermittent and sharp in character.  Patient has tried OTC ibuprofen and naproxen with relief. Denies worsening symptoms.  Reports similar symptoms in the past that improved with shots.  This is not the worst headache of their life. Complains of subjective fever, chills, nausea, and improving vomiting x 6 episodes since symptom onset.  Patient denies aura, rhinorrhea, watery eyes, chest pain, SOB, abdominal pain, weakness, numbness or tingling.    ROS: As per HPI.  Past Medical History:  Diagnosis Date  . Herpes genitalia   . Hypertension   . Migraines    Past Surgical History:  Procedure Laterality Date  . TUBAL LIGATION     No Known Allergies No current facility-administered medications on file prior to encounter.    Current Outpatient Medications on File Prior to Encounter  Medication Sig Dispense Refill  . ARIPiprazole (ABILIFY) 10 MG tablet Take 10 mg by mouth daily.    Marland Kitchen aspirin-acetaminophen-caffeine (EXCEDRIN MIGRAINE) 250-250-65 MG tablet Take 3 tablets by mouth at bedtime as needed for migraine.    . cariprazine (VRAYLAR) capsule Take 3 mg by mouth daily.    Marland Kitchen HYDROcodone-acetaminophen (NORCO/VICODIN) 5-325 MG tablet Take 2 tablets by mouth every 4 (four) hours as needed. (Patient not taking: Reported on 07/29/2017) 10 tablet 0  . hydrOXYzine (VISTARIL) 25 MG capsule Take 25 mg by mouth daily at 8 pm.    . ibuprofen (ADVIL,MOTRIN) 600 MG tablet Take 1 tablet (600 mg total) by mouth every 6 (six) hours as needed. (Patient not taking: Reported on 07/29/2017) 30 tablet 0  . methocarbamol (ROBAXIN-750) 750 MG tablet Take 1 tablet (750 mg total) by mouth 4 (four) times daily. (Patient not  taking: Reported on 07/29/2017) 30 tablet 0  . ondansetron (ZOFRAN ODT) 4 MG disintegrating tablet Take 1 tablet (4 mg total) by mouth every 8 (eight) hours as needed for nausea or vomiting. (Patient not taking: Reported on 07/29/2017) 20 tablet 0  . SUMAtriptan (IMITREX) 20 MG/ACT nasal spray 1 spray in one nostril.  May repeat once in 2 hours if headache persists or recurs. (Patient not taking: Reported on 07/29/2017) 1 Inhaler 2  . traMADol (ULTRAM) 50 MG tablet Take 1 tablet (50 mg total) by mouth every 12 (twelve) hours as needed. (Patient not taking: Reported on 07/29/2017) 10 tablet 0   Social History   Socioeconomic History  . Marital status: Single    Spouse name: Not on file  . Number of children: Not on file  . Years of education: Not on file  . Highest education level: Not on file  Occupational History  . Not on file  Social Needs  . Financial resource strain: Not on file  . Food insecurity:    Worry: Not on file    Inability: Not on file  . Transportation needs:    Medical: Not on file    Non-medical: Not on file  Tobacco Use  . Smoking status: Never Smoker  . Smokeless tobacco: Never Used  Substance and Sexual Activity  . Alcohol use: No  . Drug use: No  . Sexual activity: Yes    Birth control/protection: Condom  Lifestyle  . Physical activity:    Days  per week: Not on file    Minutes per session: Not on file  . Stress: Not on file  Relationships  . Social connections:    Talks on phone: Not on file    Gets together: Not on file    Attends religious service: Not on file    Active member of club or organization: Not on file    Attends meetings of clubs or organizations: Not on file    Relationship status: Not on file  . Intimate partner violence:    Fear of current or ex partner: Not on file    Emotionally abused: Not on file    Physically abused: Not on file    Forced sexual activity: Not on file  Other Topics Concern  . Not on file  Social History  Narrative  . Not on file   Family History  Problem Relation Age of Onset  . Diabetes Mother     OBJECTIVE:  Vitals:   10/28/17 1719 10/28/17 1721  BP:  (!) 142/80  Pulse:  81  Resp:  20  Temp:  98 F (36.7 C)  SpO2:  100%  Weight: 238 lb (108 kg)     General appearance: alert; no distress Eyes: PERRLA; EOMI HENT: normocephalic; atraumatic Neck: supple with FROM Lungs: clear to auscultation bilaterally Heart: regular rate and rhythm.  Radial pulses 2+ symmetrical bilaterally Extremities: no edema; symmetrical with no gross deformities Skin: warm and dry Neurologic: CN 2-12 grossly intact; finger to nose without difficulty; normal gait; strength and sensation intact bilaterally about the upper and lower extremities Psychological: alert and cooperative; normal mood and affect  ASSESSMENT & PLAN:  1. Migraine without aura and without status migrainosus, not intractable     Meds ordered this encounter  Medications  . naproxen (NAPROSYN) 500 MG tablet    Sig: Take 1 tablet (500 mg total) by mouth 2 (two) times daily.    Dispense:  30 tablet    Refill:  0    Order Specific Question:   Supervising Provider    Answer:   Isa Rankin [phone]  . ondansetron (ZOFRAN) 4 MG tablet    Sig: Take 1 tablet (4 mg total) by mouth every 6 (six) hours.    Dispense:  12 tablet    Refill:  0    Order Specific Question:   Supervising Provider    Answer:   Isa Rankin [[id number]]   Rest and drink fluids Declines shots Will trial naproxen 500 mg twice daily Zofran prescribed.  Take as directed for nausea and vomiting Follow up with PCP for further evaluation and management Return or go to the ED if you have any new or worsening symptoms such as worsening headache, chest pain, shortness of breath, etc...  Reviewed expectations re: course of current medical issues. Questions answered. Outlined signs and symptoms indicating need for more acute intervention. Patient  verbalized understanding. After Visit Summary given.   Rennis Harding, PA-C 10/28/17 1828

## 2017-12-30 ENCOUNTER — Encounter (HOSPITAL_COMMUNITY): Payer: Self-pay | Admitting: Emergency Medicine

## 2017-12-30 ENCOUNTER — Emergency Department (HOSPITAL_COMMUNITY)
Admission: EM | Admit: 2017-12-30 | Discharge: 2017-12-30 | Disposition: A | Discharge status: Home / Self Care | Payer: Self-pay | Attending: Emergency Medicine | Admitting: Emergency Medicine

## 2017-12-30 DIAGNOSIS — F419 Anxiety disorder, unspecified: Secondary | ICD-10-CM | POA: Insufficient documentation

## 2017-12-30 DIAGNOSIS — Z9119 Patient's noncompliance with other medical treatment and regimen: Secondary | ICD-10-CM | POA: Insufficient documentation

## 2017-12-30 DIAGNOSIS — I10 Essential (primary) hypertension: Secondary | ICD-10-CM | POA: Insufficient documentation

## 2017-12-30 DIAGNOSIS — Z79899 Other long term (current) drug therapy: Secondary | ICD-10-CM | POA: Insufficient documentation
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
## 2017-12-30 NOTE — Discharge Instructions (Addendum)
Follow-up with [facility] next week

## 2017-12-30 NOTE — ED Triage Notes (Addendum)
Patient here from home reporting that she did not take medication today and would like a dose here. Patient denies SI/HI. Requesting to speak with counselor. Take 10mg  Aripiprazole and 25mg  Hydroxyzine.

## 2017-12-30 NOTE — ED Notes (Signed)
Bed: WLPT3 Expected date:  Expected time:  Means of arrival:  Comments: Not taking meds, bipolar

## 2017-12-30 NOTE — ED Notes (Signed)
Bed: WTR8 Expected date:  Expected time:  Means of arrival:  Comments: Triage 
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
## 2018-02-13 ENCOUNTER — Encounter: Payer: Self-pay | Admitting: Internal Medicine

## 2018-04-23 ENCOUNTER — Encounter (HOSPITAL_COMMUNITY): Payer: Self-pay | Admitting: Clinical

## 2018-04-23 ENCOUNTER — Ambulatory Visit (HOSPITAL_COMMUNITY)
Admission: RE | Admit: 2018-04-23 | Discharge: 2018-04-23 | Disposition: A | Discharge status: Home / Self Care | Payer: Self-pay | Attending: Psychiatry | Admitting: Psychiatry

## 2018-04-23 DIAGNOSIS — F314 Bipolar disorder, current episode depressed, severe, without psychotic features: Secondary | ICD-10-CM | POA: Insufficient documentation

## 2018-04-23 DIAGNOSIS — I10 Essential (primary) hypertension: Secondary | ICD-10-CM | POA: Insufficient documentation

## 2018-04-23 DIAGNOSIS — Z91410 Personal history of adult physical and sexual abuse: Secondary | ICD-10-CM | POA: Insufficient documentation

## 2018-04-23 DIAGNOSIS — Z9114 Patient's other noncompliance with medication regimen: Secondary | ICD-10-CM | POA: Insufficient documentation

## 2018-04-23 DIAGNOSIS — Z833 Family history of diabetes mellitus: Secondary | ICD-10-CM | POA: Insufficient documentation
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
## 2019-11-01 ENCOUNTER — Emergency Department (HOSPITAL_COMMUNITY)
Admission: EM | Admit: 2019-11-01 | Discharge: 2019-11-02 | Disposition: A | Discharge status: Home / Self Care | Payer: Self-pay | Attending: Emergency Medicine | Admitting: Emergency Medicine

## 2019-11-01 ENCOUNTER — Encounter (HOSPITAL_COMMUNITY): Payer: Self-pay

## 2019-11-01 DIAGNOSIS — Z79899 Other long term (current) drug therapy: Secondary | ICD-10-CM | POA: Insufficient documentation

## 2019-11-01 DIAGNOSIS — E876 Hypokalemia: Secondary | ICD-10-CM | POA: Insufficient documentation

## 2019-11-01 DIAGNOSIS — R112 Nausea with vomiting, unspecified: Secondary | ICD-10-CM | POA: Insufficient documentation

## 2019-11-01 DIAGNOSIS — R55 Syncope and collapse: Secondary | ICD-10-CM | POA: Insufficient documentation

## 2019-11-01 DIAGNOSIS — I10 Essential (primary) hypertension: Secondary | ICD-10-CM | POA: Insufficient documentation

## 2019-11-01 LAB — COMPREHENSIVE METABOLIC PANEL
ALT: 37 U/L (ref 0–44)
AST: 39 U/L (ref 15–41)
Albumin: 4.2 g/dL (ref 3.5–5.0)
Alkaline Phosphatase: 41 U/L (ref 38–126)
Anion gap: 12 (ref 5–15)
BUN: 10 mg/dL (ref 6–20)
CO2: 24 mmol/L (ref 22–32)
Calcium: 9.2 mg/dL (ref 8.9–10.3)
Chloride: 103 mmol/L (ref 98–111)
Creatinine, Ser: 0.89 mg/dL (ref 0.44–1.00)
GFR calc Af Amer: >60 mL/min (ref >60)
GFR calc non Af Amer: >60 mL/min (ref >60)
Glucose, Bld: 157 mg/dL — ABNORMAL HIGH (ref 70–99)
Potassium: 2.9 mmol/L — ABNORMAL LOW (ref 3.5–5.1)
Sodium: 139 mmol/L (ref 135–145)
Total Bilirubin: 1.1 mg/dL (ref 0.3–1.2)
Total Protein: 8 g/dL (ref 6.5–8.1)

## 2019-11-01 LAB — URINALYSIS, ROUTINE W REFLEX MICROSCOPIC
Bilirubin Urine: NEGATIVE
Glucose, UA: NEGATIVE mg/dL
Hgb urine dipstick: NEGATIVE
Ketones, ur: NEGATIVE mg/dL
Nitrite: NEGATIVE
Protein, ur: 100 mg/dL — AB
Specific Gravity, Urine: 1.029 (ref 1.005–1.030)
pH: 5 (ref 5.0–8.0)

## 2019-11-01 LAB — CBC
HCT: 37.7 % (ref 36.0–46.0)
Hemoglobin: 12.8 g/dL (ref 12.0–15.0)
MCH: 30.5 pg (ref 26.0–34.0)
MCHC: 34 g/dL (ref 30.0–36.0)
MCV: 90 fL (ref 80.0–100.0)
Platelets: 300 10*3/uL (ref 150–400)
RBC: 4.19 MIL/uL (ref 3.87–5.11)
RDW: 12.5 % (ref 11.5–15.5)
WBC: 8.3 10*3/uL (ref 4.0–10.5)
nRBC: 0 % (ref 0.0–0.2)

## 2019-11-01 LAB — LIPASE, BLOOD: Lipase: 24 U/L (ref 11–51)

## 2019-11-01 LAB — I-STAT BETA HCG BLOOD, ED (MC, WL, AP ONLY): I-stat hCG, quantitative: <5 m[IU]/mL (ref <5)

## 2019-11-01 MED ORDER — ONDANSETRON 4 MG PO TBDP
4.0000 mg | ORAL_TABLET | Freq: Once | ORAL | Status: DC | PRN
  Filled 2019-11-01: qty 1

## 2019-11-01 NOTE — ED Triage Notes (Signed)
Pt from home with [employer] transportation for c.o n/v. No abd pain, VSS, pt vomiting in triage

## 2019-11-02 MED ORDER — ONDANSETRON HCL 4 MG PO TABS: 4.0000 mg | ORAL_TABLET | Freq: Four times a day (QID) | ORAL | 0 refills | Status: AC

## 2019-11-02 MED ORDER — ONDANSETRON HCL 4 MG/2ML IJ SOLN
4.0000 mg | Freq: Once | INTRAMUSCULAR | Status: AC
  Administered 2019-11-02: 09:00:00 4 mg via INTRAVENOUS
  Filled 2019-11-02: qty 2

## 2019-11-02 MED ORDER — SODIUM CHLORIDE 0.9 % IV BOLUS
1000.0000 mL | Freq: Once | INTRAVENOUS | Status: AC
  Administered 2019-11-02: 09:00:00 1000 mL via INTRAVENOUS

## 2019-11-02 MED ORDER — POTASSIUM CHLORIDE CRYS ER 20 MEQ PO TBCR
40.0000 meq | EXTENDED_RELEASE_TABLET | Freq: Once | ORAL | Status: AC
  Administered 2019-11-02: 09:00:00 40 meq via ORAL
  Filled 2019-11-02: qty 2

## 2019-11-02 MED ORDER — POTASSIUM CHLORIDE ER 10 MEQ PO TBCR: 10.0000 meq | EXTENDED_RELEASE_TABLET | Freq: Every day | ORAL | 0 refills | Status: AC

## 2019-11-02 NOTE — ED Provider Notes (Signed)
[facility] EMERGENCY DEPARTMENT Provider Note   CSN: [id number] Arrival date & time: 11/01/19  1811     History Chief Complaint  Patient presents with  . Emesis    Gina [name] is a 39 y.o. female.  HPI   Pt is a 39 y/o female with a h/o herpes, HTN, migraines, who presents to the ED today for eval of nausea and vomiting that started yesterday. States she worked yesterday and did not eat anything all day. She came home and had an episode of lightheadedness where she felt like she was going to pass out. She did not actually have a syncopal episode. She then had several episodes of vomiting. This has since resolved. Denies any abd pain, diarrhea, constipation, fevers, dysuria, frequency, urgency.  States that at the time of my eval which is 14 hours after she has been in the department she is feeling back to baseline.   States she has been feeling a little more depressed lately and has not been taking her medications.  She denies any suicidal, homicidal ideations or AVH.  Past Medical History:  Diagnosis Date  . Herpes genitalia   . Hypertension   . Migraines     Patient Active Problem List   Diagnosis Date Noted  . Acute left flank pain 08/23/2017  . Healthcare maintenance 08/23/2017  . Sickle cell trait (HCC) 08/23/2017  . Migraine headache 04/15/2015  . Generalized anxiety disorder 02/06/2015  . Cephalalgia 01/05/2015  . Genital herpes 01/05/2015  . Seasonal allergies 01/05/2015  . Obesity 01/05/2015  . History of hypertension 01/05/2015    Past Surgical History:  Procedure Laterality Date  . TUBAL LIGATION       OB History   No obstetric history on file.     Family History  Problem Relation Age of Onset  . Diabetes Mother     Social History   Tobacco Use  . Smoking status: Never Smoker  . Smokeless tobacco: Never Used  Substance Use Topics  . Alcohol use: No  . Drug use: No    Home Medications Prior to Admission medications    Medication Sig Start Date End Date Taking? Authorizing Provider  ARIPiprazole (ABILIFY) 10 MG tablet Take 10 mg by mouth daily.    [provider]  aspirin-acetaminophen-caffeine (EXCEDRIN MIGRAINE) [phone] MG tablet Take 3 tablets by mouth at bedtime as needed for migraine.    [provider]  hydrOXYzine (VISTARIL) 25 MG capsule Take 25 mg by mouth daily at 8 pm.    [provider]  naproxen (NAPROSYN) 500 MG tablet Take 1 tablet (500 mg total) by mouth 2 (two) times daily. 10/28/17   Wurst, [place], PA-C  ondansetron (ZOFRAN) 4 MG tablet Take 1 tablet (4 mg total) by mouth every 6 (six) hours. 11/02/19   Karandeep Resende S, PA-C  potassium chloride (KLOR-CON) 10 MEQ tablet Take 1 tablet (10 mEq total) by mouth daily for 4 days. 11/02/19 11/06/19  Jancie Kercher S, PA-C    Allergies    Patient has no known allergies.  Review of Systems   Review of Systems  Constitutional: Negative for fever.  HENT: Negative for ear pain and sore throat.   Eyes: Negative for visual disturbance.  Respiratory: Negative for cough and shortness of breath.   Cardiovascular: Negative for chest pain.  Gastrointestinal: Positive for nausea and vomiting. Negative for abdominal pain, constipation and diarrhea.  Genitourinary: Negative for dysuria and hematuria.  Musculoskeletal: Negative for back pain.  Skin: Negative for rash.  Neurological: Positive for light-headedness. Negative for seizures and syncope.  Psychiatric/Behavioral: Positive for dysphoric mood.       No SI, HI or AVH  All other systems reviewed and are negative.   Physical Exam Updated Vital Signs BP 121/82 (BP Location: Right Arm)   Pulse (!) 58   Temp 97.7 F (36.5 C) (Oral)   Resp 18   Ht 5\' 7"  (1.702 m)   SpO2 100%   BMI 37.28 kg/m   Physical Exam Vitals and nursing note reviewed.  Constitutional:      General: She is not in acute distress.    Appearance: She is well-developed.  HENT:     Head:  Normocephalic and atraumatic.     Mouth/Throat:     Mouth: Mucous membranes are dry.  Eyes:     Conjunctiva/sclera: Conjunctivae normal.  Cardiovascular:     Rate and Rhythm: Normal rate and regular rhythm.     Pulses: Normal pulses.     Heart sounds: Normal heart sounds. No murmur heard.   Pulmonary:     Effort: Pulmonary effort is normal. No respiratory distress.     Breath sounds: Normal breath sounds. No wheezing, rhonchi or rales.  Abdominal:     General: Bowel sounds are normal.     Palpations: Abdomen is soft.     Tenderness: There is no abdominal tenderness. There is no guarding or rebound.  Musculoskeletal:     Cervical back: Neck supple.  Skin:    General: Skin is warm and dry.  Neurological:     Mental Status: She is alert.     Comments: Clear speech, no facial droop, moving all extremities. Ambulatory with steady gait.   Psychiatric:        Mood and Affect: Affect is flat.        Behavior: Behavior is withdrawn.        Thought Content: Thought content does not include homicidal or suicidal ideation. Thought content does not include homicidal or suicidal plan.     ED Results / Procedures / Treatments   Labs (all labs ordered are listed, but only abnormal results are displayed) Labs Reviewed  COMPREHENSIVE METABOLIC PANEL - Abnormal; Notable for the following components:      Result Value   Potassium 2.9 (*)    Glucose, Bld 157 (*)    All other components within normal limits  URINALYSIS, ROUTINE W REFLEX MICROSCOPIC - Abnormal; Notable for the following components:   Color, Urine AMBER (*)    APPearance CLOUDY (*)    Protein, ur 100 (*)    Leukocytes,Ua MODERATE (*)    Bacteria, UA MANY (*)    All other components within normal limits  URINE CULTURE  LIPASE, BLOOD  CBC  I-STAT BETA HCG BLOOD, ED ([facility], [facility], [facility] ONLY)    EKG None  Radiology No results found.  Procedures Procedures (including critical care time)  Medications Ordered in  ED Medications  ondansetron (ZOFRAN-ODT) disintegrating tablet 4 mg (has no administration in time range)  potassium chloride SA (KLOR-CON) CR tablet 40 mEq (40 mEq Oral Given 11/02/19 0843)  ondansetron (ZOFRAN) injection 4 mg (4 mg Intravenous Given 11/02/19 0842)  sodium chloride 0.9 % bolus 1,000 mL (1,000 mLs Intravenous New Bag/Given 11/02/19 0834)    ED Course  I have reviewed the triage vital signs and the nursing notes.  Pertinent labs & imaging results that were available during my care of the patient were reviewed by  me and considered in my medical decision making (see chart for details).    MDM Rules/Calculators/A&P                          39 year old female presenting to the emergency department today for evaluation of nausea vomiting.  Had near syncopal episode prior to arrival and had several episodes of vomiting afterwards.  She attributes this to not eating and drinking all day yesterday and going to work.  She is not significantly orthostatic on exam but she does appear dry.  She was given IV fluids.    Reviewed/interpreted labs. CBC was unremarkable CMP with some hypokalemia, otherwise reassuring Lipase is negative UA appears to be contaminated, patient has no urinary symptoms. Will culture urine but have low suspicion for UTI Beta-hCG negative  EKG with no prolonged QT, nonspecific T wave changes but no ischemic changes noted and no evidence of STEMI. No arrhythmia noted.  Patient's work-up here is reassuring. Her abdomen is soft and nontender and I have very low suspicion for an emergent intra-abdominal/pelvic cause of symptoms. I feel that her symptoms are likely related to decreased p.o. intake leading to a presyncopal episode. She was given IV fluids and was able to tolerate p.o. in the emergency department. Following this she felt some improvement of her symptoms. I think that her lack of p.o. intake may be related to some depressive symptoms from lack of taking her  medication. She does not appear acutely suicidal and does not appear to require an emergent psychiatric evaluation but I did advise her to follow-up with her psychiatrist in regards to this. I also gave her some information to follow-up with the PCP.  Final Clinical Impression(s) / ED Diagnoses Final diagnoses:  Near syncope  Non-intractable vomiting with nausea, unspecified vomiting type  Hypokalemia    Rx / DC Orders ED Discharge Orders         Ordered    ondansetron (ZOFRAN) 4 MG tablet  Every 6 hours        11/02/19 1004    potassium chloride (KLOR-CON) 10 MEQ tablet  Daily        11/02/19 [address], [place], PA-C 11/02/19 1019    Gerhard Munch, MD 11/03/19 [phone]

## 2019-11-02 NOTE — Discharge Instructions (Addendum)
You were given a prescription for zofran to help with your nausea. Please take as directed.  You were also given a prescription for potassium to help increase your potassium as it was noted to be low today.  Please take as directed.  Please make sure you are staying well hydrated and eating 2 meals per day.   A culture was sent of your urine today to determine if there is any bacterial growth. If the results of the culture are positive and you require an antibiotic or a change of your prescribed antibiotic you will be contacted by the hospital. If the results are negative you will not be contacted.  Please follow up with your primary care provider within 5-7 days for re-evaluation of your symptoms. If you do not have a primary care provider, information for a healthcare clinic has been provided for you to make arrangements for follow up care.   Please make sure that you follow-up with your mental health care provider and restart your medications.  Please return to the emergency department for any new or worsening symptoms.

## 2019-11-03 LAB — URINE CULTURE: Culture: 20000 — AB

## 2020-06-20 ENCOUNTER — Encounter: Payer: Self-pay | Admitting: *Deleted

## 2020-07-31 ENCOUNTER — Encounter (HOSPITAL_COMMUNITY): Payer: Self-pay | Admitting: Emergency Medicine

## 2020-07-31 ENCOUNTER — Emergency Department (HOSPITAL_COMMUNITY)
Admission: EM | Admit: 2020-07-31 | Discharge: 2020-07-31 | Disposition: A | Discharge status: Home / Self Care | Payer: Self-pay | Attending: Emergency Medicine | Admitting: Emergency Medicine

## 2020-07-31 ENCOUNTER — Emergency Department (HOSPITAL_COMMUNITY): Payer: Self-pay

## 2020-07-31 ENCOUNTER — Other Ambulatory Visit: Payer: Self-pay

## 2020-07-31 DIAGNOSIS — B009 Herpesviral infection, unspecified: Secondary | ICD-10-CM | POA: Insufficient documentation

## 2020-07-31 DIAGNOSIS — R61 Generalized hyperhidrosis: Secondary | ICD-10-CM | POA: Insufficient documentation

## 2020-07-31 DIAGNOSIS — R0789 Other chest pain: Secondary | ICD-10-CM | POA: Insufficient documentation

## 2020-07-31 DIAGNOSIS — N9489 Other specified conditions associated with female genital organs and menstrual cycle: Secondary | ICD-10-CM | POA: Insufficient documentation

## 2020-07-31 DIAGNOSIS — Z7982 Long term (current) use of aspirin: Secondary | ICD-10-CM | POA: Insufficient documentation

## 2020-07-31 DIAGNOSIS — R110 Nausea: Secondary | ICD-10-CM | POA: Insufficient documentation

## 2020-07-31 DIAGNOSIS — R55 Syncope and collapse: Secondary | ICD-10-CM | POA: Insufficient documentation

## 2020-07-31 DIAGNOSIS — I10 Essential (primary) hypertension: Secondary | ICD-10-CM | POA: Insufficient documentation

## 2020-07-31 DIAGNOSIS — F419 Anxiety disorder, unspecified: Secondary | ICD-10-CM | POA: Insufficient documentation

## 2020-07-31 DIAGNOSIS — R002 Palpitations: Secondary | ICD-10-CM | POA: Insufficient documentation

## 2020-07-31 LAB — CBC WITH DIFFERENTIAL/PLATELET
Abs Immature Granulocytes: 0.02 10*3/uL (ref 0.00–0.07)
Basophils Absolute: 0 10*3/uL (ref 0.0–0.1)
Basophils Relative: 1 %
Eosinophils Absolute: 0.1 10*3/uL (ref 0.0–0.5)
Eosinophils Relative: 2 %
HCT: 38.7 % (ref 36.0–46.0)
Hemoglobin: 13.2 g/dL (ref 12.0–15.0)
Immature Granulocytes: 0 %
Lymphocytes Relative: 47 %
Lymphs Abs: 2.7 10*3/uL (ref 0.7–4.0)
MCH: 30.7 pg (ref 26.0–34.0)
MCHC: 34.1 g/dL (ref 30.0–36.0)
MCV: 90 fL (ref 80.0–100.0)
Monocytes Absolute: 0.5 10*3/uL (ref 0.1–1.0)
Monocytes Relative: 9 %
Neutro Abs: 2.4 10*3/uL (ref 1.7–7.7)
Neutrophils Relative %: 41 %
Platelets: 278 10*3/uL (ref 150–400)
RBC: 4.3 MIL/uL (ref 3.87–5.11)
RDW: 12.6 % (ref 11.5–15.5)
WBC: 5.9 10*3/uL (ref 4.0–10.5)
nRBC: 0 % (ref 0.0–0.2)

## 2020-07-31 LAB — TROPONIN I (HIGH SENSITIVITY): Troponin I (High Sensitivity): 3 ng/L (ref <18)

## 2020-07-31 LAB — COMPREHENSIVE METABOLIC PANEL
ALT: 46 U/L — ABNORMAL HIGH (ref 0–44)
AST: 40 U/L (ref 15–41)
Albumin: 4 g/dL (ref 3.5–5.0)
Alkaline Phosphatase: 38 U/L (ref 38–126)
Anion gap: 10 (ref 5–15)
BUN: 11 mg/dL (ref 6–20)
CO2: 22 mmol/L (ref 22–32)
Calcium: 9.4 mg/dL (ref 8.9–10.3)
Chloride: 102 mmol/L (ref 98–111)
Creatinine, Ser: 0.76 mg/dL (ref 0.44–1.00)
GFR, Estimated: >60 mL/min (ref >60)
Glucose, Bld: 104 mg/dL — ABNORMAL HIGH (ref 70–99)
Potassium: 3.6 mmol/L (ref 3.5–5.1)
Sodium: 134 mmol/L — ABNORMAL LOW (ref 135–145)
Total Bilirubin: 0.9 mg/dL (ref 0.3–1.2)
Total Protein: 7.8 g/dL (ref 6.5–8.1)

## 2020-07-31 LAB — URINALYSIS, ROUTINE W REFLEX MICROSCOPIC
Bilirubin Urine: NEGATIVE
Glucose, UA: NEGATIVE mg/dL
Hgb urine dipstick: NEGATIVE
Ketones, ur: NEGATIVE mg/dL
Nitrite: POSITIVE — AB
Protein, ur: NEGATIVE mg/dL
Specific Gravity, Urine: 1.023 (ref 1.005–1.030)
Squamous Epithelial / HPF: >50 — ABNORMAL HIGH (ref 0–5)
pH: 5 (ref 5.0–8.0)

## 2020-07-31 LAB — I-STAT BETA HCG BLOOD, ED (MC, WL, AP ONLY): I-stat hCG, quantitative: <5 m[IU]/mL (ref <5)

## 2020-07-31 LAB — D-DIMER, QUANTITATIVE: D-Dimer, Quant: 0.35 ug/mL-FEU (ref 0.00–0.50)

## 2020-07-31 LAB — CBG MONITORING, ED: Glucose-Capillary: 85 mg/dL (ref 70–99)

## 2020-07-31 LAB — LIPASE, BLOOD: Lipase: 32 U/L (ref 11–51)

## 2020-07-31 MED ORDER — SULFAMETHOXAZOLE-TRIMETHOPRIM 800-160 MG PO TABS: 1.0000 | ORAL_TABLET | Freq: Two times a day (BID) | ORAL | 0 refills | Status: AC

## 2020-07-31 MED ORDER — ONDANSETRON 4 MG PO TBDP
4.0000 mg | ORAL_TABLET | Freq: Once | ORAL | Status: AC
  Administered 2020-07-31: 4 mg via ORAL
  Filled 2020-07-31: qty 1

## 2020-07-31 NOTE — ED Provider Notes (Addendum)
[facility] EMERGENCY DEPARTMENT Provider Note   CSN: [id number] Arrival date & time: 07/31/20  1117     History Chief Complaint  Patient presents with   Near Syncope    Gina [name] is a 40 y.o. female.   Near Syncope Associated symptoms include chest pain. Pertinent negatives include no abdominal pain and no shortness of breath. 39yoF PMHx sickle cell trait, migraines, GAD, HTN, presenting for near-syncope x2 preceded by palpitations. 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 Associated sx include anxiety, mild diaphoresis, chest tightness (midsternal, nonradiating, improved w/ resolution of episode, no worsening factors). Additionally notes some nausea now.  Mom had similar palpitations, no associated dx. Patient does report multiple recent stressors, including death of her mom. No further medical concern at this time, including fevers, chills, cough, SOB, rhinorrhea, syncope, seizure, vomiting, diarrhea, dysuria, hematuria, rash.     Past Medical History:  Diagnosis Date   Herpes genitalia    Hypertension    Migraines     Patient Active Problem List   Diagnosis Date Noted   Acute left flank pain 08/23/2017   Healthcare maintenance 08/23/2017   Sickle cell trait (HCC) 08/23/2017   Migraine headache 04/15/2015   Generalized anxiety disorder 02/06/2015   Cephalalgia 01/05/2015   Genital herpes 01/05/2015   Seasonal allergies 01/05/2015   Obesity 01/05/2015   History of hypertension 01/05/2015    Past Surgical History:  Procedure Laterality Date   TUBAL LIGATION       OB History   No obstetric history on file.     Family History  Problem Relation Age of Onset   Diabetes Mother     Social History   Tobacco Use   Smoking status: Never   Smokeless tobacco: Never  Substance Use Topics   Alcohol use: No   Drug use: No    Home Medications Prior to Admission  medications   Medication Sig Start Date End Date Taking? Authorizing Provider  sulfamethoxazole-trimethoprim (BACTRIM DS) 800-160 MG tablet Take 1 tablet by mouth 2 (two) times daily for 3 days. 07/31/20 08/03/20 Yes Keyauna Graefe, MD  ARIPiprazole (ABILIFY) 10 MG tablet Take 10 mg by mouth daily.    [provider]  aspirin-acetaminophen-caffeine (EXCEDRIN MIGRAINE) [phone] MG tablet Take 3 tablets by mouth at bedtime as needed for migraine.    [provider]  hydrOXYzine (VISTARIL) 25 MG capsule Take 25 mg by mouth daily at 8 pm.    [provider]  naproxen (NAPROSYN) 500 MG tablet Take 1 tablet (500 mg total) by mouth 2 (two) times daily. 10/28/17   Wurst, [place], PA-C  ondansetron (ZOFRAN) 4 MG tablet Take 1 tablet (4 mg total) by mouth every 6 (six) hours. 11/02/19   Couture, Cortni S, PA-C  potassium chloride (KLOR-CON) 10 MEQ tablet Take 1 tablet (10 mEq total) by mouth daily for 4 days. 11/02/19 11/06/19  Couture, Cortni S, PA-C    Allergies    Patient has no known allergies.  Review of Systems   Review of Systems  Constitutional:  Positive for diaphoresis. Negative for chills and fever.  HENT:  Negative for ear pain, rhinorrhea and sore throat.   Eyes:  Negative for discharge and redness.  Respiratory:  Negative for cough and shortness of breath.   Cardiovascular:  Positive for chest pain, palpitations and near-syncope. Negative for leg swelling.  Gastrointestinal:  Positive for nausea. Negative for abdominal distention, abdominal pain,  constipation, diarrhea and vomiting.  Genitourinary:  Negative for dysuria and hematuria.  Musculoskeletal:  Negative for gait problem and neck stiffness.  Skin:  Negative for rash and wound.  Neurological:  Positive for light-headedness. Negative for seizures and syncope.  Psychiatric/Behavioral:  Negative for agitation and confusion.    Physical Exam Updated Vital Signs BP (!) 137/91   Pulse 89   Temp 98.2  F (36.8 C) (Oral)   Resp 17   Ht 5\' 7"  (1.702 m)   Wt 108.9 kg   LMP 07/01/2020   SpO2 100%   BMI 37.59 kg/m   Physical Exam Vitals and nursing note reviewed.  Constitutional:      General: She is not in acute distress.    Appearance: Normal appearance. She is not ill-appearing, toxic-appearing or diaphoretic.  HENT:     Head: Normocephalic and atraumatic.     Right Ear: External ear normal.     Left Ear: External ear normal.     Nose: Nose normal.     Mouth/Throat:     Mouth: Mucous membranes are moist.     Pharynx: Oropharynx is clear.  Eyes:     General: No scleral icterus.    Extraocular Movements: Extraocular movements intact.     Conjunctiva/sclera: Conjunctivae normal.     Pupils: Pupils are equal, round, and reactive to light.  Cardiovascular:     Rate and Rhythm: Normal rate and regular rhythm.     Heart sounds: No murmur heard.   No friction rub. No gallop.  Pulmonary:     Effort: Pulmonary effort is normal.     Breath sounds: Normal breath sounds. No stridor. No wheezing, rhonchi or rales.  Abdominal:     General: There is no distension.     Palpations: Abdomen is soft.     Tenderness: There is no abdominal tenderness. There is no right CVA tenderness, left CVA tenderness, guarding or rebound.  Musculoskeletal:        General: No deformity or signs of injury.     Cervical back: Normal range of motion and neck supple. No rigidity or tenderness.  Skin:    General: Skin is warm and dry.     Capillary Refill: Capillary refill takes less than 2 seconds.  Neurological:     General: No focal deficit present.     Mental Status: She is alert and oriented to person, place, and time. Mental status is at baseline.     Cranial Nerves: No cranial nerve deficit.     Motor: No weakness.     Gait: Gait normal.  Psychiatric:        Mood and Affect: Mood normal.        Behavior: Behavior normal.        Thought Content: Thought content normal.        Judgment: Judgment  normal.    ED Results / Procedures / Treatments   Labs (all labs ordered are listed, but only abnormal results are displayed) Labs Reviewed  URINALYSIS, ROUTINE W REFLEX MICROSCOPIC - Abnormal; Notable for the following components:      Result Value   Color, Urine AMBER (*)    APPearance CLOUDY (*)    Nitrite POSITIVE (*)    Leukocytes,Ua LARGE (*)    Bacteria, UA MANY (*)    Squamous Epithelial / LPF >50 (*)    Non Squamous Epithelial 0-5 (*)    All other components within normal limits  COMPREHENSIVE METABOLIC PANEL - Abnormal; Notable  for the following components:   Sodium 134 (*)    Glucose, Bld 104 (*)    ALT 46 (*)    All other components within normal limits  URINE CULTURE  CBC WITH DIFFERENTIAL/PLATELET  LIPASE, BLOOD  D-DIMER, QUANTITATIVE  CBG MONITORING, ED  I-STAT BETA HCG BLOOD, ED ([facility], [facility], [facility] ONLY)  TROPONIN I (HIGH SENSITIVITY)    EKG EKG Interpretation  Date/Time:  Friday July 31 2020 11:26:31 EDT Ventricular Rate:  82 PR Interval:  154 QRS Duration: 78 QT Interval:  372 QTC Calculation: 434 R Axis:   -8 Text Interpretation: Normal sinus rhythm Cannot rule out Anterior infarct , age undetermined Abnormal ECG rate faster than previous Confirmed by Frederick Peers [phone]) on 07/31/2020 5:13:13 PM  Radiology DG Chest 2 View  Result Date: 07/31/2020 CLINICAL DATA:  Shortness of breath Dizziness Nausea EXAM: CHEST - 2 VIEW COMPARISON:  07/22/2015 FINDINGS: The heart size and mediastinal contours are within normal limits. Both lungs are clear. The visualized skeletal structures are unremarkable. IMPRESSION: No active cardiopulmonary disease. Electronically Signed   By: Acquanetta Belling M.D.   On: 07/31/2020 12:23    Procedures Procedures   Medications Ordered in ED Medications  ondansetron (ZOFRAN-ODT) disintegrating tablet 4 mg (4 mg Oral Given 07/31/20 1400)    ED Course  I have reviewed the triage vital signs and the nursing notes.  Pertinent labs &  imaging results that were available during my care of the patient were reviewed by me and considered in my medical decision making (see chart for details).    MDM Rules/Calculators/A&P                          This is a 39yoF w/ hx and pe as above.  Initial interventions: zofran for nausea w/ improvement  Ddx included: syncope/pre-syncope, acs, pe, pna, ptx, dissection, arrhythmia, metabolic derangement  All studies independently reviewed by myself, d/w the attending physician, and factored into my mdm. -EKG: NSR 82bpm, normal axis, normal intervals, TWI's in III, aVF, septal/lateral leads present in prior from 11/2019 -UA: infected appearing -unremarkable: cbc, cmp, bhcg, lipase, dimer, trop x1, CXR  Presentation appears most c/w presyncope, possibly r/t palpitations, as well as a UTI. Reassuring trop, dimer, EKG, and imaging. Breath sounds present bilaterally, no localizing or adventitious sounds. No FND. Reassuring metabolic panel.  Therefore, feel pt is stable for dc home w/ close outpatient follow-up. Provided her w/ contact info for local clinic. Bactrim initiated for UTI. Return precautions discussed. She understands and agrees. Pt HDS, ambulatory, nontoxic on reevaluation. Subsequently discharged.  Final Clinical Impression(s) / ED Diagnoses Final diagnoses:  Near syncope    Rx / DC Orders ED Discharge Orders          Ordered    sulfamethoxazole-trimethoprim (BACTRIM DS) 800-160 MG tablet  2 times daily        07/31/20 2008             Colvin Caroli, MD 08/01/20 0159    Colvin Caroli, MD 08/01/20 0200    Little, Ambrose Finland, MD 08/03/20 2159

## 2020-07-31 NOTE — Discharge Instructions (Addendum)
Please return for any worsening symptoms, including palpitations, fainting.

## 2020-07-31 NOTE — ED Provider Notes (Signed)
Emergency Medicine Provider Triage Evaluation Note  Gina [name] , a 40 y.o. female  was evaluated in triage.  Pt complains of palpitations, dizziness, nausea and near syncope.  Ports that she has been having intermittent episodes of palpitations, dizziness, and nausea over the last "few days."  Patient reports that the symptoms come on at random.  When they come on they last for the remainder of the day.  He reports that dizziness is worse when she stands and ambulates.    Reports that this on arriving at work at 0 930 she had an episode of near syncope that was associated with nausea, dizziness, and palpitations.  Patient has had palpitations since then.  Patient currently feels palpitations.  Reports having some double vision and shortness of breath associated with her symptoms over the last few days.  Patient denies any symptoms at present  Review of Systems  Positive: Palpitations, dizziness, nausea, near syncope, double vision, shortness of breath Negative: Numbness, weakness, use, facial asymmetry, syncope, chest pain, fever, chills, abdominal pain, vomiting  Physical Exam  BP (!) 139/96 (BP Location: Left Arm)   Pulse 85   Temp 98.2 F (36.8 C) (Oral)   Resp 16   Ht 5\' 7"  (1.702 m)   Wt 108.9 kg   LMP 07/01/2020   SpO2 99%   BMI 37.59 kg/m  Gen:   Awake, no distress   Resp:  Normal effort, lungs clear to auscultation bilaterally MSK:   Moves extremities without difficulty, +5 strength to bilateral upper and lower extremities Other:  No facial asymmetry or slurred speech.  S1, S2 present with no murmurs rubs or gallops.  +2 radial pulse bilaterally  Medical Decision Making  Medically screening exam initiated at 11:33 AM.  Appropriate orders placed.  Gina [name] was informed that the remainder of the evaluation will be completed by another provider, this initial triage assessment does not replace that evaluation, and the importance of remaining in the ED until their  evaluation is complete.  The patient appears stable so that the remainder of the work up may be completed by another provider.      08/31/2020, PA-C 07/31/20 1137    10/01/20, DO 08/03/20 1746

## 2020-07-31 NOTE — ED Triage Notes (Signed)
Pt reports dizziness, nausea and palpitations that has been intermittent the past few days. Pt reporting some dizziness and palpitations at this time. Denies pain. Pt reports near syncopal episode at work.

## 2020-08-03 LAB — URINE CULTURE: Culture: >=100000 — AB

## 2020-08-04 ENCOUNTER — Telehealth: Payer: Self-pay | Admitting: Emergency Medicine

## 2020-08-04 NOTE — Telephone Encounter (Signed)
Post ED Visit - Positive Culture Follow-up  Culture report reviewed by antimicrobial stewardship pharmacist: [facility] []  , Pharm.D. []  Enzo Bi, Pharm.D., BCPS AQ-ID []  , Pharm.D., BCPS []  Celedonio Miyamoto, Pharm.D., BCPS []  [place], Garvin Fila.D., BCPS, AAHIVP []  , Pharm.D., BCPS, AAHIVP []  Georgina Pillion, PharmD, BCPS []  , PharmD, BCPS []  [place], PharmD, BCPS []  [address], PharmD []  , PharmD, BCPS []  Estella Husk, PharmD  Pharmacy Team []  Lysle Pearl, PharmD []  , PharmD []  Phillips Climes, PharmD []  , Rph []  Agapito Games) , PharmD []  Verlan Friends, PharmD []  , PharmD []  Mervyn Gay, PharmD []  , PharmD []  Vinnie Level, PharmD []  [facility], PharmD []  , PharmD []  Len Childs, PharmD   Positive urine culture Treated with sulfamethoxazole-trimethoprim, organism sensitive to the same and no further patient follow-up is required at this time.  08/04/2020, 9:27 AM

## 2021-02-25 ENCOUNTER — Other Ambulatory Visit: Payer: Self-pay

## 2021-02-25 ENCOUNTER — Emergency Department (HOSPITAL_COMMUNITY)
Admission: EM | Admit: 2021-02-25 | Discharge: 2021-02-25 | Disposition: A | Discharge status: Home / Self Care | Payer: Medicaid Other | Attending: Emergency Medicine | Admitting: Emergency Medicine

## 2021-02-25 DIAGNOSIS — R350 Frequency of micturition: Secondary | ICD-10-CM

## 2021-02-25 DIAGNOSIS — I10 Essential (primary) hypertension: Secondary | ICD-10-CM | POA: Insufficient documentation

## 2021-02-25 DIAGNOSIS — N3000 Acute cystitis without hematuria: Secondary | ICD-10-CM | POA: Insufficient documentation

## 2021-02-25 DIAGNOSIS — R55 Syncope and collapse: Secondary | ICD-10-CM | POA: Insufficient documentation

## 2021-02-25 DIAGNOSIS — R0689 Other abnormalities of breathing: Secondary | ICD-10-CM | POA: Insufficient documentation

## 2021-02-25 DIAGNOSIS — R002 Palpitations: Secondary | ICD-10-CM | POA: Insufficient documentation

## 2021-02-25 DIAGNOSIS — R42 Dizziness and giddiness: Secondary | ICD-10-CM | POA: Insufficient documentation

## 2021-02-25 LAB — URINALYSIS, ROUTINE W REFLEX MICROSCOPIC
Bilirubin Urine: NEGATIVE
Glucose, UA: NEGATIVE mg/dL
Ketones, ur: NEGATIVE mg/dL
Nitrite: POSITIVE — AB
Protein, ur: NEGATIVE mg/dL
Specific Gravity, Urine: 1.019 (ref 1.005–1.030)
pH: 5 (ref 5.0–8.0)

## 2021-02-25 LAB — COMPREHENSIVE METABOLIC PANEL
ALT: 39 U/L (ref 0–44)
AST: 37 U/L (ref 15–41)
Albumin: 4 g/dL (ref 3.5–5.0)
Alkaline Phosphatase: 38 U/L (ref 38–126)
Anion gap: 7 (ref 5–15)
BUN: 9 mg/dL (ref 6–20)
CO2: 25 mmol/L (ref 22–32)
Calcium: 8.8 mg/dL — ABNORMAL LOW (ref 8.9–10.3)
Chloride: 104 mmol/L (ref 98–111)
Creatinine, Ser: 0.89 mg/dL (ref 0.44–1.00)
GFR, Estimated: >60 mL/min (ref >60)
Glucose, Bld: 107 mg/dL — ABNORMAL HIGH (ref 70–99)
Potassium: 3.7 mmol/L (ref 3.5–5.1)
Sodium: 136 mmol/L (ref 135–145)
Total Bilirubin: 0.7 mg/dL (ref 0.3–1.2)
Total Protein: 7.7 g/dL (ref 6.5–8.1)

## 2021-02-25 LAB — CBC WITH DIFFERENTIAL/PLATELET
Abs Immature Granulocytes: 0.01 10*3/uL (ref 0.00–0.07)
Basophils Absolute: 0 10*3/uL (ref 0.0–0.1)
Basophils Relative: 1 %
Eosinophils Absolute: 0.2 10*3/uL (ref 0.0–0.5)
Eosinophils Relative: 3 %
HCT: 37.9 % (ref 36.0–46.0)
Hemoglobin: 12.9 g/dL (ref 12.0–15.0)
Immature Granulocytes: 0 %
Lymphocytes Relative: 48 %
Lymphs Abs: 2.9 10*3/uL (ref 0.7–4.0)
MCH: 30.9 pg (ref 26.0–34.0)
MCHC: 34 g/dL (ref 30.0–36.0)
MCV: 90.9 fL (ref 80.0–100.0)
Monocytes Absolute: 0.5 10*3/uL (ref 0.1–1.0)
Monocytes Relative: 8 %
Neutro Abs: 2.4 10*3/uL (ref 1.7–7.7)
Neutrophils Relative %: 40 %
Platelets: 308 10*3/uL (ref 150–400)
RBC: 4.17 MIL/uL (ref 3.87–5.11)
RDW: 12.5 % (ref 11.5–15.5)
WBC: 6 10*3/uL (ref 4.0–10.5)
nRBC: 0 % (ref 0.0–0.2)

## 2021-02-25 LAB — BRAIN NATRIURETIC PEPTIDE: B Natriuretic Peptide: 8 pg/mL (ref 0.0–100.0)

## 2021-02-25 LAB — TSH: TSH: 1.677 u[IU]/mL (ref 0.350–4.500)

## 2021-02-25 MED ORDER — CEPHALEXIN 500 MG PO CAPS: 500.0000 mg | ORAL_CAPSULE | Freq: Two times a day (BID) | ORAL | 0 refills | Status: AC

## 2021-02-25 NOTE — Discharge Instructions (Signed)
Your work-up today was overall reassuring.  Your urinalysis did show evidence of urinary tract infection similar to what was discovered last time you had a lightheaded episode.  Given your frequency, we will treat for UTI and sent to culture.  Please take the antibiotics and follow-up with your primary doctor.  A TSH was sent and you may also follow-up on this result with your PCP.  Please rest and stay hydrated and if any symptoms change or worsen acutely, please return to the nearest emergency department.

## 2021-02-25 NOTE — ED Triage Notes (Signed)
Pt. Stated, I was at work and felt light headed, and felt like I was going to pass out. This has happened before

## 2021-02-25 NOTE — ED Provider Triage Note (Signed)
Emergency Medicine Provider Triage Evaluation Note  Gina [name] , a 41 y.o. female  was evaluated in triage.  Pt was standing at work and all of a sudden felt like she was going to pass out.  Got dizzy.  Reports that this is happened before and they told her nothing resolved however "she knows her body" and knows that there is something wrong.  Says that she stands all day for her job  Review of Systems  Positive: Dizziness and near syncope Negative: Blood in stool, hematemesis, medical conditions  Physical Exam  BP (!) 135/104 (BP Location: Right Arm)    Pulse 73    Temp 98.7 F (37.1 C) (Oral)    Resp 16    LMP 01/14/2021    SpO2 100%  Gen:   Awake, no distress   Resp:  Normal effort  MSK:   Moves extremities without difficulty  Other:  PERRLA, alert and oriented, moving all extremities.  RRR  Medical Decision Making  Medically screening exam initiated at 10:59 AM.  Appropriate orders placed.  Gina [name] was informed that the remainder of the evaluation will be completed by another provider, this initial triage assessment does not replace that evaluation, and the importance of remaining in the ED until their evaluation is complete.     Saddie Benders, PA-C 02/25/21 1100

## 2021-02-25 NOTE — ED Provider Notes (Signed)
[place] EMERGENCY DEPARTMENT Provider Note   CSN: [id number] Arrival date & time: 02/25/21  1041     History  Chief Complaint  Patient presents with   light headed   Near Syncope    Gina [name] is a 41 y.o. female.  The history is provided by the patient and medical records. No language interpreter was used.  Near Syncope This is a recurrent problem. The current episode started 3 to 5 hours ago. The problem has been resolved. Pertinent negatives include no chest pain, no abdominal pain, no headaches and no shortness of breath. The symptoms are aggravated by standing. Nothing relieves the symptoms. She has tried nothing for the symptoms. The treatment provided no relief.      Home Medications Prior to Admission medications   Medication Sig Start Date End Date Taking? Authorizing Provider  ARIPiprazole (ABILIFY) 10 MG tablet Take 10 mg by mouth daily.    [provider]  aspirin-acetaminophen-caffeine (EXCEDRIN MIGRAINE) [phone] MG tablet Take 3 tablets by mouth at bedtime as needed for migraine.    [provider]  hydrOXYzine (VISTARIL) 25 MG capsule Take 25 mg by mouth daily at 8 pm.    [provider]  naproxen (NAPROSYN) 500 MG tablet Take 1 tablet (500 mg total) by mouth 2 (two) times daily. 10/28/17   Wurst, [place], PA-C  ondansetron (ZOFRAN) 4 MG tablet Take 1 tablet (4 mg total) by mouth every 6 (six) hours. 11/02/19   Couture, Cortni S, PA-C  potassium chloride (KLOR-CON) 10 MEQ tablet Take 1 tablet (10 mEq total) by mouth daily for 4 days. 11/02/19 11/06/19  Couture, Cortni S, PA-C      Allergies    Patient has no known allergies.    Review of Systems   Review of Systems  Constitutional:  Negative for chills, diaphoresis, fatigue and fever.  HENT:  Negative for congestion.   Eyes:  Negative for visual disturbance.  Respiratory:  Negative for cough, chest tightness, shortness of breath and wheezing.    Cardiovascular:  Positive for palpitations and near-syncope. Negative for chest pain and leg swelling.  Gastrointestinal:  Negative for abdominal pain, constipation, diarrhea, nausea and vomiting.  Genitourinary:  Positive for frequency. Negative for dysuria.  Musculoskeletal:  Negative for back pain, neck pain and neck stiffness.  Skin:  Negative for rash and wound.  Neurological:  Positive for light-headedness. Negative for dizziness, syncope, weakness and headaches.  Psychiatric/Behavioral:  Negative for agitation and confusion.   All other systems reviewed and are negative.  Physical Exam Updated Vital Signs BP (!) 134/97 (BP Location: Left Arm)    Pulse 74    Temp 98.7 F (37.1 C) (Oral)    Resp 16    LMP 01/14/2021    SpO2 100%  Physical Exam Vitals and nursing note reviewed.  Constitutional:      General: She is not in acute distress.    Appearance: She is well-developed. She is not ill-appearing, toxic-appearing or diaphoretic.  HENT:     Head: Normocephalic and atraumatic.     Nose: No congestion or rhinorrhea.     Mouth/Throat:     Mouth: Mucous membranes are moist.     Pharynx: No oropharyngeal exudate or posterior oropharyngeal erythema.  Eyes:     Conjunctiva/sclera: Conjunctivae normal.     Pupils: Pupils are equal, round, and reactive to light.  Cardiovascular:     Rate and Rhythm: Normal rate and regular rhythm.     Heart  sounds: No murmur heard. Pulmonary:     Effort: Pulmonary effort is normal. No respiratory distress.     Breath sounds: Normal breath sounds. No wheezing or rhonchi.  Chest:     Chest wall: No tenderness.  Abdominal:     General: Abdomen is flat.     Palpations: Abdomen is soft.     Tenderness: There is no abdominal tenderness. There is no right CVA tenderness, left CVA tenderness, guarding or rebound.  Musculoskeletal:        General: No swelling or tenderness.     Cervical back: Neck supple. No tenderness.  Skin:    General: Skin is  warm and dry.     Capillary Refill: Capillary refill takes less than 2 seconds.     Findings: No erythema.  Neurological:     General: No focal deficit present.     Mental Status: She is alert.  Psychiatric:        Mood and Affect: Mood normal.    ED Results / Procedures / Treatments   Labs (all labs ordered are listed, but only abnormal results are displayed) Labs Reviewed  URINALYSIS, ROUTINE W REFLEX MICROSCOPIC - Abnormal; Notable for the following components:      Result Value   APPearance CLOUDY (*)    Hgb urine dipstick SMALL (*)    Nitrite POSITIVE (*)    Leukocytes,Ua LARGE (*)    Bacteria, UA MANY (*)    Non Squamous Epithelial 0-5 (*)    All other components within normal limits  URINE CULTURE  BRAIN NATRIURETIC PEPTIDE  CBC WITH DIFFERENTIAL/PLATELET  COMPREHENSIVE METABOLIC PANEL  TSH    EKG EKG Interpretation  Date/Time:  Thursday February 25 2021 10:52:44 EST Ventricular Rate:  79 PR Interval:  156 QRS Duration: 76 QT Interval:  386 QTC Calculation: 442 R Axis:   -4 Text Interpretation: Normal sinus rhythm Possible Anterior infarct , age undetermined Abnormal ECG When compared to prior, similar appearance. No STEMI Confirmed by Antony Blackbird [phone]) on 02/25/2021 2:32:04 PM  Radiology No results found.  Procedures Procedures    Medications Ordered in ED Medications - No data to display  ED Course/ Medical Decision Making/ A&P                           Medical Decision Making Amount and/or Complexity of Data Reviewed Labs: ordered.  Risk Prescription drug management.    Gina [name] is a 41 y.o. female with a past medical history significant for hypertension, anxiety, and migraines who presents with urinary frequency and lightheadedness.  According to patient, she was near syncopal again today and she been having some lightheadedness throughout the month.  She denies any chest pain but reports occasional palpitations.  She says that the  last time this happened she was found to have urinary tract infection.  She reports some urinary frequency but denies dysuria.  She denies any recent constipation, diarrhea, nausea, vomiting, fevers, chills, congestion, or cough.  She reports this is how she is felt the past.  Denies any pain right now.  She reports that her last menstrual cycle was the middle of last month and she does not think she is pregnant.  She does not want to be tested for pregnancy although this was offered.  She denies any recent medication changes, drug use, alcohol changes, or any other complaints.  On exam, lungs clear and chest nontender.  Abdomen nontender.  No murmur.  Patient moving all extremities.  Patient well-appearing.  Exam otherwise reassuring.  Patient had some work-up started in triage including a CBC, BMP, and urinalysis.  Urine does show evidence of urinary tract infection which fits with a similar presentation in the past with UTI and lightheadedness.  As the patient has had some palpitations, we will get a metabolic panel to look for electrolyte disturbance and will also add on a TSH.  We will add on a urine culture given her recurrent urinary tract infection.  If metabolic panel is reassuring, anticipate discharge with prescription for antibiotics for urinary tract infection and plans to follow-up with the PCP.  We discussed that the TSH may not return today in an expedient manner but she can follow-up with PCP for this result as well.  Patient will be given some to eat and drink and p.o. challenge.  If she is feeling better, anticipate discharge with antibiotics.  Care be transferred to oncoming team to await for metabolic panel results.  Anticipate discharge         Final Clinical Impression(s) / ED Diagnoses Final diagnoses:  Near syncope  Lightheadedness  Acute cystitis without hematuria  Urinary frequency    Rx / DC Orders ED Discharge Orders          Ordered    cephALEXin  (KEFLEX) 500 MG capsule  2 times daily        02/25/21 1448            Clinical Impression: 1. Near syncope   2. Lightheadedness   3. Acute cystitis without hematuria   4. Urinary frequency     Disposition: Care transferred to oncoming team to await rest of diagnostic work-up however anticipate recurrent episodic lightheadedness/near syncope in the setting of urinary tract infection.  Plan is to treat with antibiotics and discharge home if work-up remains reassuring and she is feeling better.  This note was prepared with assistance of Systems analyst. Occasional wrong-word or sound-a-like substitutions may have occurred due to the inherent limitations of voice recognition software.     Dreux Mcgroarty, Gwenyth Allegra, MD 02/25/21 [phone]

## 2021-02-27 LAB — URINE CULTURE: Culture: >=100000 — AB

## 2021-02-28 ENCOUNTER — Telehealth (HOSPITAL_BASED_OUTPATIENT_CLINIC_OR_DEPARTMENT_OTHER): Payer: Self-pay | Admitting: *Deleted

## 2021-02-28 NOTE — Telephone Encounter (Signed)
Post ED Visit - Positive Culture Follow-up  Culture report reviewed by antimicrobial stewardship pharmacist: [facility] []  [address], Pharm.D. []  [place], Pharm.D., BCPS AQ-ID []  , Pharm.D., BCPS []  Celedonio Miyamoto, .D., BCPS []  [place], .D., BCPS, AAHIVP []  Georgina Pillion, Pharm.D., BCPS, AAHIVP []  [address], PharmD, BCPS []  , PharmD, BCPS []  [place], PharmD, BCPS []  [address], PharmD []  , PharmD, BCPS [x]  Estella Husk, PharmD  Pharmacy Team []  Lysle Pearl, PharmD []  , PharmD []  Phillips Climes, PharmD []  , Rph []  Agapito Games) , PharmD []  Verlan Friends, PharmD []  , PharmD []  Mervyn Gay, PharmD []  , PharmD []  Delmar Landau, PharmD []  [facility], PharmD []  , PharmD []  Len Childs, PharmD   Positive urine culture Treated with Cephalexin, organism sensitive to the same and no further patient follow-up is required at this time.  02/28/2021, 1:15 PM

## 2022-02-04 IMAGING — CR DG CHEST 2V
2 series · 2 of 2 positions shown · non-contrast
Comparison: 07/22/2015

CLINICAL DATA: Shortness of breath

Dizziness
Nausea
EXAM:
CHEST - 2 VIEW

[chest lat]
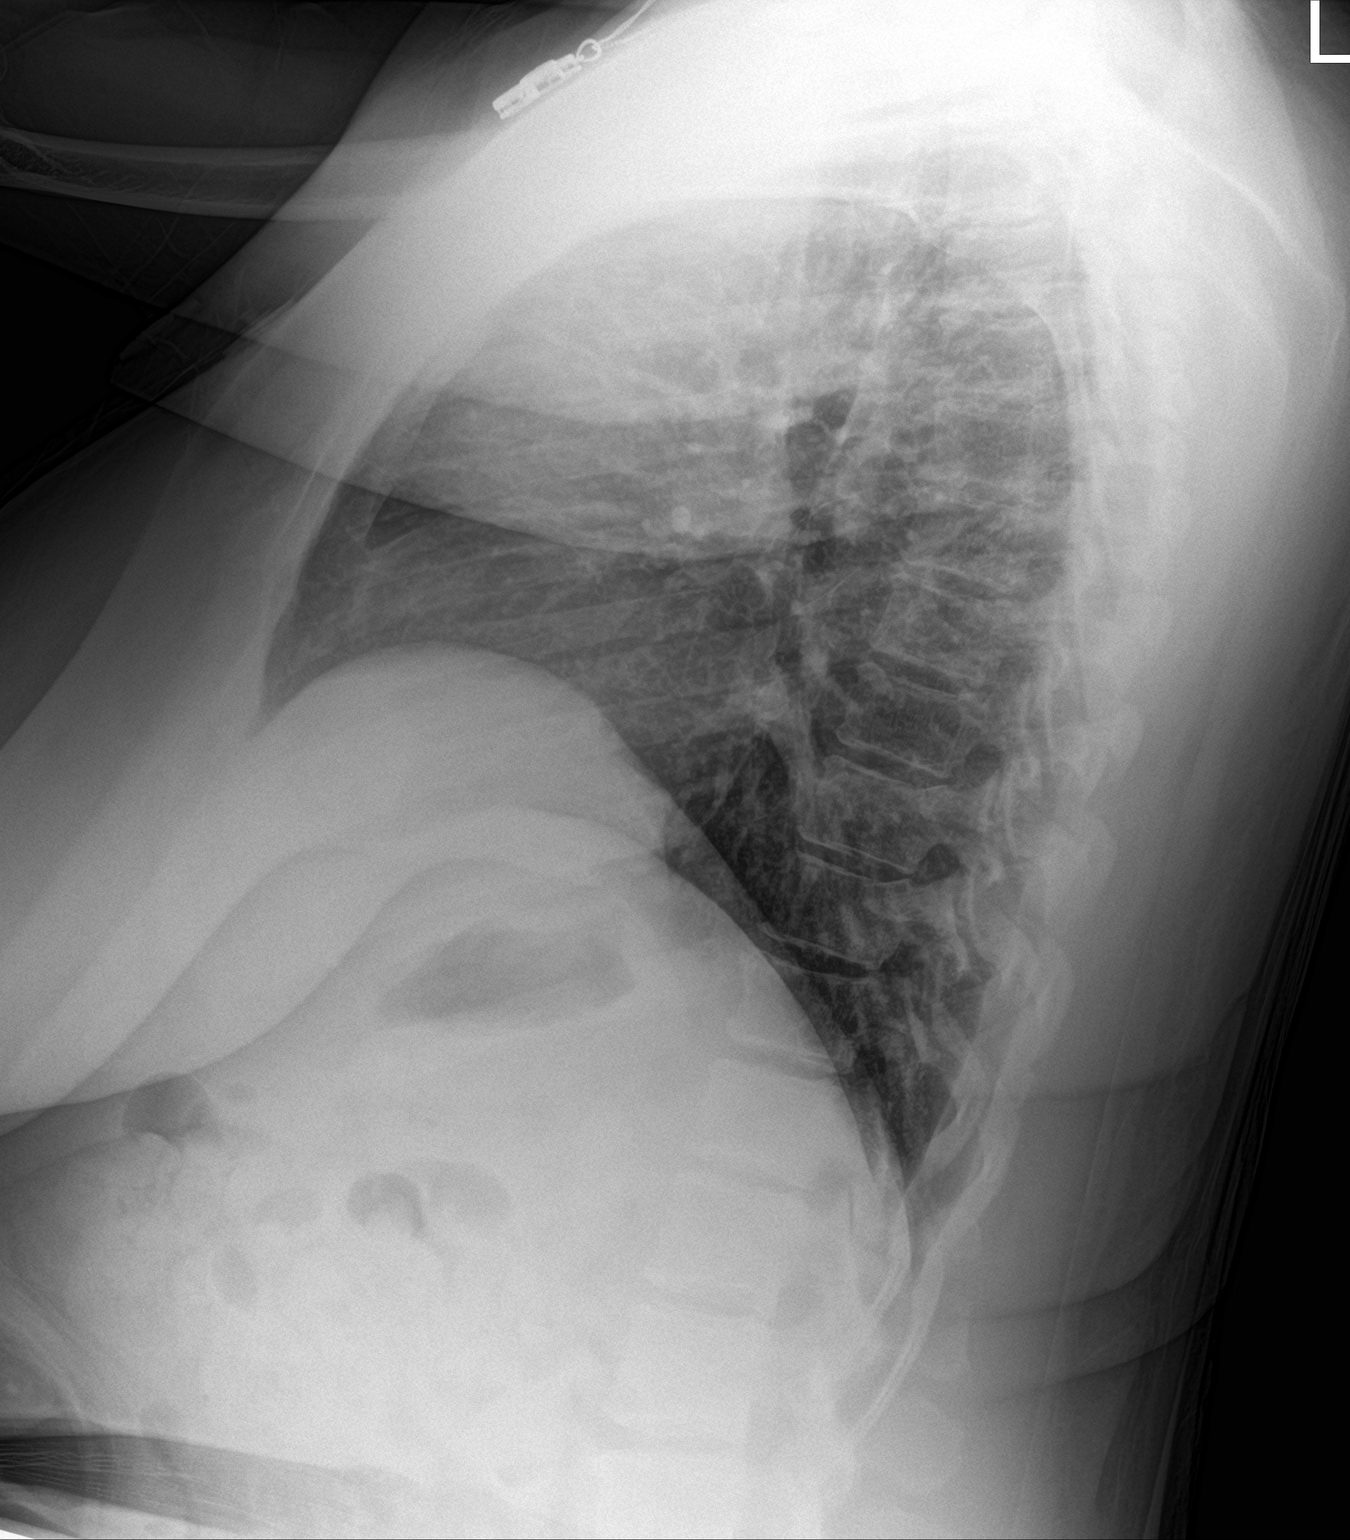

[chest ap]
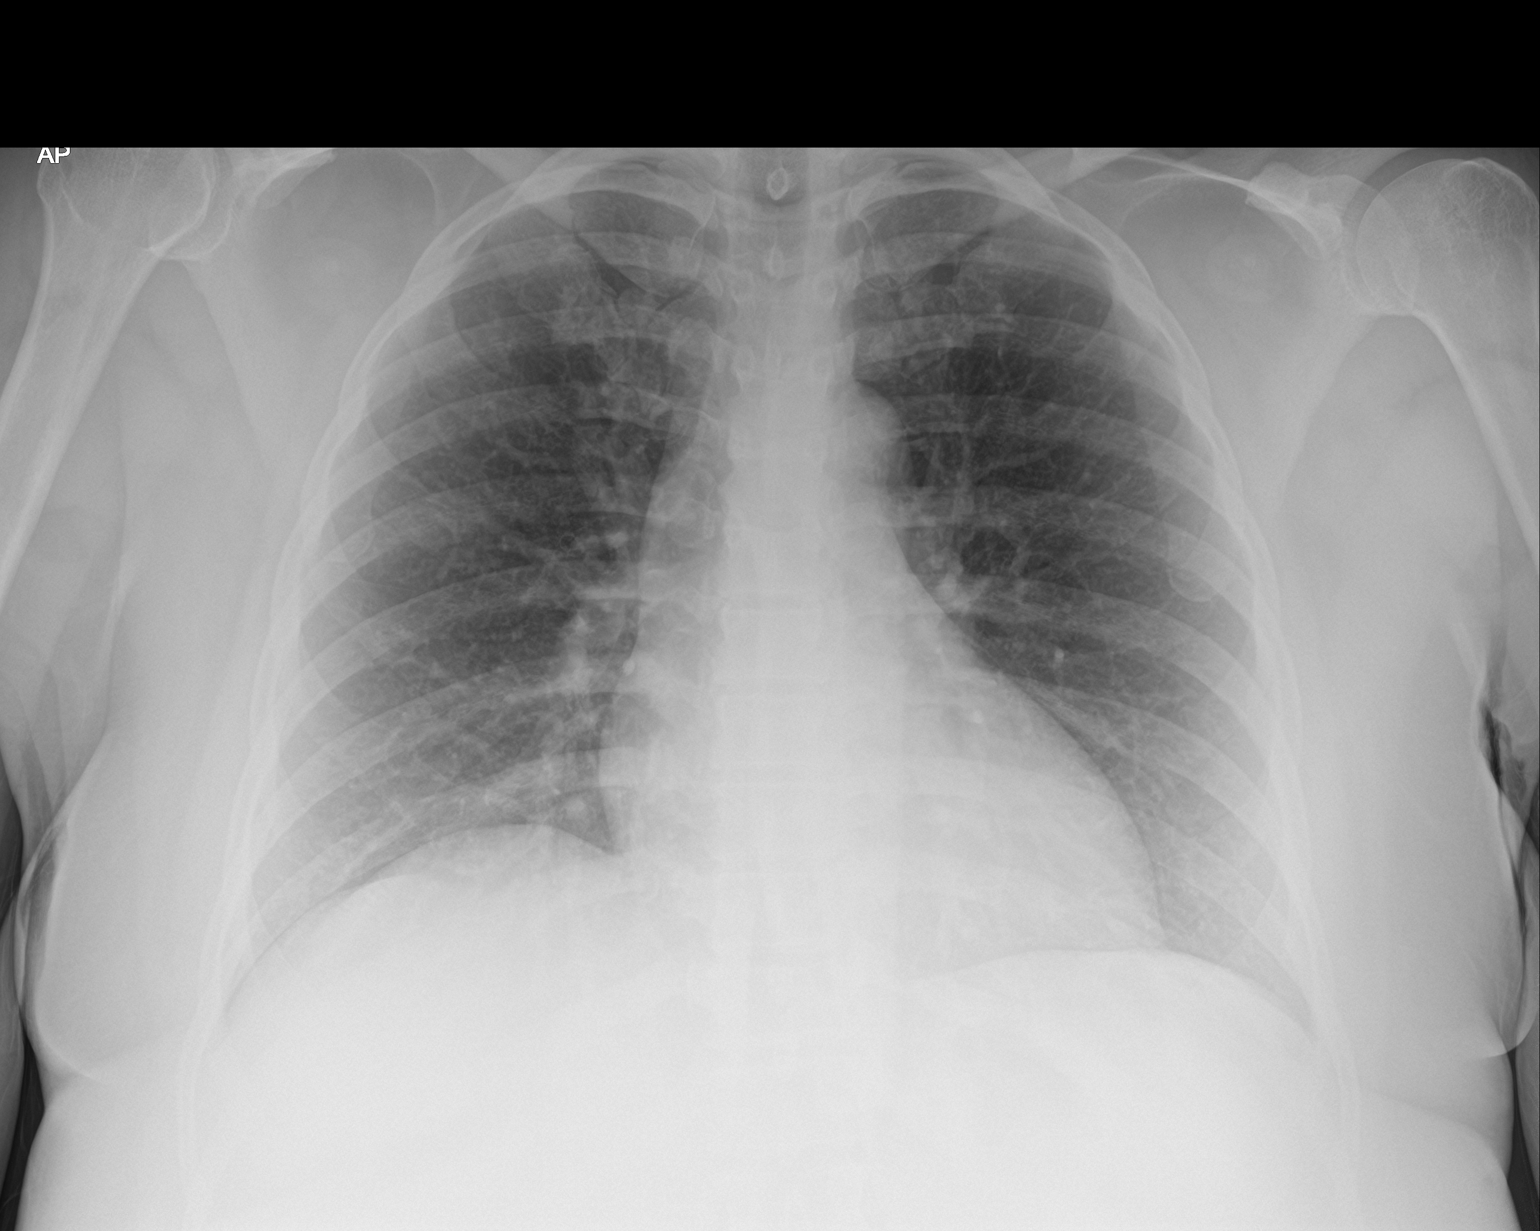

[2 of 2 positions shown; findings below may reference images not displayed]

FINDINGS: The heart size and mediastinal contours are within normal limits.
Both lungs are clear. The visualized skeletal structures are
unremarkable.
IMPRESSION: No active cardiopulmonary disease.

## 2022-02-05 ENCOUNTER — Other Ambulatory Visit: Payer: Self-pay

## 2022-02-05 ENCOUNTER — Encounter (HOSPITAL_COMMUNITY): Payer: Self-pay | Admitting: Pharmacy Technician

## 2022-02-05 ENCOUNTER — Emergency Department (HOSPITAL_COMMUNITY)
Admission: EM | Admit: 2022-02-05 | Discharge: 2022-02-05 | Disposition: A | Discharge status: Home / Self Care | Payer: No Typology Code available for payment source | Attending: Emergency Medicine | Admitting: Emergency Medicine

## 2022-02-05 DIAGNOSIS — K029 Dental caries, unspecified: Secondary | ICD-10-CM | POA: Insufficient documentation

## 2022-02-05 DIAGNOSIS — K0889 Other specified disorders of teeth and supporting structures: Secondary | ICD-10-CM | POA: Diagnosis present

## 2022-02-05 MED ORDER — LIDOCAINE VISCOUS HCL 2 % MT SOLN
15.0000 mL | Freq: Once | OROMUCOSAL | Status: AC
  Administered 2022-02-05: 15 mL via OROMUCOSAL
  Filled 2022-02-05: qty 15

## 2022-02-05 MED ORDER — ACETAMINOPHEN 500 MG PO TABS: 500.0000 mg | ORAL_TABLET | Freq: Four times a day (QID) | ORAL | 0 refills | Status: AC | PRN

## 2022-02-05 MED ORDER — LIDOCAINE VISCOUS HCL 2 % MT SOLN: 15.0000 mL | OROMUCOSAL | 0 refills | Status: AC | PRN

## 2022-02-05 MED ORDER — IBUPROFEN 800 MG PO TABS: 800.0000 mg | ORAL_TABLET | Freq: Three times a day (TID) | ORAL | 0 refills | Status: AC

## 2022-02-05 NOTE — Discharge Instructions (Signed)
Please get in touch with a dentist.  There is a list of resources attached to these discharge papers however you may also call any dentist in the area.  Alternate the Tylenol and ibuprofen but do not take them with the BCs.  As we discussed, the lidocaine solution is something that you should not eat or drink anything hot for at least 30 minutes after.  Return with any fevers, chills or worsening symptoms although be aware that we are unable to do any dental work.  Your work note is is attached.

## 2022-02-05 NOTE — ED Notes (Signed)
Patient Alert and oriented to baseline. Stable and ambulatory to baseline. Patient verbalized understanding of the discharge instructions.  Patient belongings were taken by the patient.   

## 2022-02-05 NOTE — ED Triage Notes (Signed)
Pt here with reports of R sided dental pain onset 3 days ago. Denies fevers.

## 2022-02-05 NOTE — ED Provider Notes (Signed)
[facility] EMERGENCY DEPARTMENT Provider Note   CSN: [id number] Arrival date & time: 02/05/22  0913     History  Chief Complaint  Patient presents with   Dental Pain    Gina [name] is a 42 y.o. female presenting with dental pain.  She reports that for the past 3 days she has had severe right-sided dental pain.  Does not remember when she last saw dentist.  No fevers or chills and has not noted any drainage.  Says it is painful to chew.   Dental Pain      Home Medications Prior to Admission medications   Medication Sig Start Date End Date Taking? Authorizing Provider  ARIPiprazole (ABILIFY) 10 MG tablet Take 10 mg by mouth daily.    [provider]  aspirin-acetaminophen-caffeine (EXCEDRIN MIGRAINE) [phone] MG tablet Take 3 tablets by mouth at bedtime as needed for migraine.    [provider]  hydrOXYzine (VISTARIL) 25 MG capsule Take 25 mg by mouth daily at 8 pm.    [provider]  naproxen (NAPROSYN) 500 MG tablet Take 1 tablet (500 mg total) by mouth 2 (two) times daily. 10/28/17   Wurst, [place], PA-C  ondansetron (ZOFRAN) 4 MG tablet Take 1 tablet (4 mg total) by mouth every 6 (six) hours. 11/02/19   Couture, Cortni S, PA-C  potassium chloride (KLOR-CON) 10 MEQ tablet Take 1 tablet (10 mEq total) by mouth daily for 4 days. 11/02/19 11/06/19  Couture, Cortni S, PA-C      Allergies    Patient has no known allergies.    Review of Systems   Review of Systems  Physical Exam Updated Vital Signs BP 125/84 (BP Location: Right Arm)   Pulse 99   Temp 98.3 F (36.8 C)   Resp 17   SpO2 92%  Physical Exam Vitals and nursing note reviewed.  Constitutional:      Appearance: Normal appearance.  HENT:     Head: Normocephalic and atraumatic.     Mouth/Throat:     Mouth: Mucous membranes are moist. Mucous membranes are pale.     Dentition: Dental caries present. No dental tenderness or dental abscesses.     Pharynx:  Oropharynx is clear.     Tonsils: No tonsillar exudate or tonsillar abscesses.     Comments: Multiple dental caries.  Appears to have wisdom teeth erupting from the gumline.  No abscess noted.  Uvula midline, no PTA/RPA Eyes:     General: No scleral icterus.    Conjunctiva/sclera: Conjunctivae normal.  Pulmonary:     Effort: Pulmonary effort is normal. No respiratory distress.  Skin:    Findings: No rash.  Neurological:     Mental Status: She is alert.  Psychiatric:        Mood and Affect: Mood normal.     ED Results / Procedures / Treatments   Labs (all labs ordered are listed, but only abnormal results are displayed) Labs Reviewed - No data to display  EKG None  Radiology No results found.  Procedures Procedures   Medications Ordered in ED Medications  lidocaine (XYLOCAINE) 2 % viscous mouth solution 15 mL (has no administration in time range)    ED Course/ Medical Decision Making/ A&P                           Medical Decision Making Risk Prescription drug management.   42 year old female presenting with dental pain.  No obvious  dental infection, do not believe she needs to be started on antibiotics.  Will send lidocaine solution, ibuprofen and Tylenol to her pharmacy as well is discharged her with dental resources.  Airway clear, tolerating secretions.  Does not appear to have an emergent condition requiring further evaluation and she is agreeable to discharge from triage.  Final Clinical Impression(s) / ED Diagnoses Final diagnoses:  Pain, dental    Rx / DC Orders ED Discharge Orders          Ordered    lidocaine (XYLOCAINE) 2 % solution  As needed        02/05/22 0948    acetaminophen (TYLENOL) 500 MG tablet  Every 6 hours PRN        02/05/22 0948    ibuprofen (ADVIL) 800 MG tablet  3 times daily        02/05/22 0948           Results and diagnoses were explained to the patient. Return precautions discussed in full. Patient had no additional  questions and expressed complete understanding.   This chart was dictated using voice recognition software.  Despite best efforts to proofread,  errors can occur which can change the documentation meaning.    Woodroe Chen 02/05/22 0712    Linwood Dibbles, MD 02/06/22 [phone]

## 2022-02-08 ENCOUNTER — Other Ambulatory Visit: Payer: Self-pay

## 2022-02-08 ENCOUNTER — Encounter (HOSPITAL_COMMUNITY): Payer: Self-pay

## 2022-02-08 ENCOUNTER — Emergency Department (HOSPITAL_COMMUNITY): Payer: No Typology Code available for payment source

## 2022-02-08 ENCOUNTER — Emergency Department (HOSPITAL_COMMUNITY)
Admission: EM | Admit: 2022-02-08 | Discharge: 2022-02-09 | Disposition: A | Discharge status: Home / Self Care | Payer: No Typology Code available for payment source | Attending: Emergency Medicine | Admitting: Emergency Medicine

## 2022-02-08 DIAGNOSIS — I10 Essential (primary) hypertension: Secondary | ICD-10-CM | POA: Diagnosis not present

## 2022-02-08 DIAGNOSIS — K047 Periapical abscess without sinus: Secondary | ICD-10-CM | POA: Diagnosis not present

## 2022-02-08 DIAGNOSIS — L03211 Cellulitis of face: Secondary | ICD-10-CM

## 2022-02-08 DIAGNOSIS — K0889 Other specified disorders of teeth and supporting structures: Secondary | ICD-10-CM | POA: Diagnosis present

## 2022-02-08 LAB — CBC WITH DIFFERENTIAL/PLATELET
Abs Immature Granulocytes: 0.02 10*3/uL (ref 0.00–0.07)
Basophils Absolute: 0 10*3/uL (ref 0.0–0.1)
Basophils Relative: 0 %
Eosinophils Absolute: 0 10*3/uL (ref 0.0–0.5)
Eosinophils Relative: 0 %
HCT: 34 % — ABNORMAL LOW (ref 36.0–46.0)
Hemoglobin: 11.6 g/dL — ABNORMAL LOW (ref 12.0–15.0)
Immature Granulocytes: 0 %
Lymphocytes Relative: 16 %
Lymphs Abs: 1.4 10*3/uL (ref 0.7–4.0)
MCH: 30.4 pg (ref 26.0–34.0)
MCHC: 34.1 g/dL (ref 30.0–36.0)
MCV: 89.2 fL (ref 80.0–100.0)
Monocytes Absolute: 0.8 10*3/uL (ref 0.1–1.0)
Monocytes Relative: 9 %
Neutro Abs: 6.4 10*3/uL (ref 1.7–7.7)
Neutrophils Relative %: 75 %
Platelets: 265 10*3/uL (ref 150–400)
RBC: 3.81 MIL/uL — ABNORMAL LOW (ref 3.87–5.11)
RDW: 12.9 % (ref 11.5–15.5)
WBC: 8.6 10*3/uL (ref 4.0–10.5)
nRBC: 0 % (ref 0.0–0.2)

## 2022-02-08 LAB — COMPREHENSIVE METABOLIC PANEL
ALT: 22 U/L (ref 0–44)
AST: 25 U/L (ref 15–41)
Albumin: 3.6 g/dL (ref 3.5–5.0)
Alkaline Phosphatase: 41 U/L (ref 38–126)
Anion gap: 10 (ref 5–15)
BUN: 7 mg/dL (ref 6–20)
CO2: 21 mmol/L — ABNORMAL LOW (ref 22–32)
Calcium: 8.8 mg/dL — ABNORMAL LOW (ref 8.9–10.3)
Chloride: 104 mmol/L (ref 98–111)
Creatinine, Ser: 0.81 mg/dL (ref 0.44–1.00)
GFR, Estimated: >60 mL/min (ref >60)
Glucose, Bld: 86 mg/dL (ref 70–99)
Potassium: 3.2 mmol/L — ABNORMAL LOW (ref 3.5–5.1)
Sodium: 135 mmol/L (ref 135–145)
Total Bilirubin: 1.1 mg/dL (ref 0.3–1.2)
Total Protein: 7.7 g/dL (ref 6.5–8.1)

## 2022-02-08 LAB — I-STAT BETA HCG BLOOD, ED (MC, WL, AP ONLY): I-stat hCG, quantitative: <5 m[IU]/mL (ref <5)

## 2022-02-08 LAB — MAGNESIUM: Magnesium: 2 mg/dL (ref 1.7–2.4)

## 2022-02-08 MED ORDER — ONDANSETRON HCL 4 MG/2ML IJ SOLN
4.0000 mg | Freq: Once | INTRAMUSCULAR | Status: AC
  Administered 2022-02-08: 4 mg via INTRAVENOUS
  Filled 2022-02-08: qty 2

## 2022-02-08 MED ORDER — SODIUM CHLORIDE 0.9 % IV SOLN
3.0000 g | Freq: Once | INTRAVENOUS | Status: AC
  Administered 2022-02-08: 3 g via INTRAVENOUS
  Filled 2022-02-08: qty 8

## 2022-02-08 MED ORDER — AMOXICILLIN-POT CLAVULANATE 875-125 MG PO TABS
1.0000 | ORAL_TABLET | Freq: Once | ORAL | Status: AC
  Administered 2022-02-08: 1 via ORAL
  Filled 2022-02-08: qty 1

## 2022-02-08 MED ORDER — DEXAMETHASONE SODIUM PHOSPHATE 10 MG/ML IJ SOLN
10.0000 mg | Freq: Once | INTRAMUSCULAR | Status: AC
  Administered 2022-02-08: 10 mg via INTRAVENOUS
  Filled 2022-02-08: qty 1

## 2022-02-08 MED ORDER — OXYCODONE-ACETAMINOPHEN 5-325 MG PO TABS
1.0000 | ORAL_TABLET | Freq: Once | ORAL | Status: AC
  Administered 2022-02-08: 1 via ORAL
  Filled 2022-02-08: qty 1

## 2022-02-08 MED ORDER — IOHEXOL 350 MG/ML SOLN
75.0000 mL | Freq: Once | INTRAVENOUS | Status: AC | PRN
  Administered 2022-02-08: 75 mL via INTRAVENOUS

## 2022-02-08 MED ORDER — POTASSIUM CHLORIDE CRYS ER 20 MEQ PO TBCR
40.0000 meq | EXTENDED_RELEASE_TABLET | Freq: Once | ORAL | Status: AC
  Administered 2022-02-08: 40 meq via ORAL
  Filled 2022-02-08: qty 2

## 2022-02-08 MED ORDER — KETOROLAC TROMETHAMINE 15 MG/ML IJ SOLN
15.0000 mg | Freq: Once | INTRAMUSCULAR | Status: AC
  Administered 2022-02-08: 15 mg via INTRAVENOUS
  Filled 2022-02-08: qty 1

## 2022-02-08 MED ORDER — SODIUM CHLORIDE 0.9 % IV SOLN
3.0000 g | Freq: Three times a day (TID) | INTRAVENOUS | Status: DC
  Administered 2022-02-09: 3 g via INTRAVENOUS
  Filled 2022-02-08: qty 8

## 2022-02-08 MED ORDER — LACTATED RINGERS IV BOLUS
1000.0000 mL | Freq: Once | INTRAVENOUS | Status: AC
  Administered 2022-02-08: 1000 mL via INTRAVENOUS

## 2022-02-08 MED ORDER — AMOXICILLIN-POT CLAVULANATE 875-125 MG PO TABS: 1.0000 | ORAL_TABLET | Freq: Two times a day (BID) | ORAL | 0 refills | Status: AC

## 2022-02-08 NOTE — Discharge Instructions (Signed)
You were seen in the emergency department for your dental pain and facial swelling. You have cellulitis or a skin infection to your face that likely originated from your infected tooth. You need to call the dentist and follow up in the office today to have your tooth fixed to prevent the infection from spreading or getting worse. You should complete the antibiotics as prescribed. You should return to the emergency department if you're having swelling below your tongue or chin, you're unable to open your mouth, you're unable to swallow your saliva, you are having fevers despite the antibiotics or if you have any other new or concerning symptoms.

## 2022-02-08 NOTE — ED Notes (Signed)
Pt ambulated to bathroom 

## 2022-02-08 NOTE — ED Triage Notes (Signed)
Pt. Stated, My face is hurting, I have a bad tooth on the bottom for a month. In the last 4 days my face has swollen and I can't hardly open my mouth.  Pt's face is swollen and unable to open her mouth for me to look inside.

## 2022-02-08 NOTE — Subjective & Objective (Addendum)
5 to 6-day history of right-sided dental pain was seen in the emergency department 3 days ago was full discharge with dental follow-up came back today with same complaints no fevers or chills She had a dental appointment today at 1130 but presented to emergency department stat. CT scan showed no drainable abscess Patient was started on antibiotics ER states she had some trismus Because of significant swelling initially Dental was consulted Requested CT CT was done ENT re transfer to tertiary center [facility], [place], [place] with no ability to take pt,  Dentis has rec observation of the pt on antibiotics and they will see in clinic in AM  Pt started Unasyn and is stable Today's Vitals   02/08/22 1553 02/08/22 1746 02/08/22 2025 02/08/22 2025  BP: (!) 142/96 (!) 147/96  (!) 144/99  Pulse: 91 100  98  Resp: 15 17  20   Temp: 98.2 F (36.8 C) 98.9 F (37.2 C)  98.9 F (37.2 C)  TempSrc: Oral Oral  Oral  SpO2: 100% 100%  98%  Weight:      Height:      PainSc:   10-Worst pain ever    Body mass index is 21.93 kg/m.   Recent Results (from the past 2160 hour(s))  Comprehensive metabolic panel     Status: Abnormal   Collection Time: 02/08/22  9:38 AM  Result Value Ref Range   Sodium 135 135 - 145 mmol/L   Potassium 3.2 (L) 3.5 - 5.1 mmol/L   Chloride 104 98 - 111 mmol/L   CO2 21 (L) 22 - 32 mmol/L   Glucose, Bld 86 70 - 99 mg/dL    Comment: Glucose reference range applies only to samples taken after fasting for at least 8 hours.   BUN 7 6 - 20 mg/dL   Creatinine, Ser 04/09/22 0.44 - 1.00 mg/dL   Calcium 8.8 (L) 8.9 - 10.3 mg/dL   Total Protein 7.7 6.5 - 8.1 g/dL   Albumin 3.6 3.5 - 5.0 g/dL   AST 25 15 - 41 U/L   ALT 22 0 - 44 U/L   Alkaline Phosphatase 41 38 - 126 U/L   Total Bilirubin 1.1 0.3 - 1.2 mg/dL   GFR, Estimated 1.06 >26 mL/min    Comment: (NOTE) Calculated using the CKD-EPI Creatinine Equation (2021)    Anion gap 10 5 - 15    Comment: Performed at [facility]  Lab, [address] [place], [place]  CBC with Differential     Status: Abnormal   Collection Time: 02/08/22  9:38 AM  Result Value Ref Range   WBC 8.6 4.0 - 10.5 K/uL   RBC 3.81 (L) 3.87 - 5.11 MIL/uL   Hemoglobin 11.6 (L) 12.0 - 15.0 g/dL   HCT 04/09/22 (L) 70.3 - 50.0 %   MCV 89.2 80.0 - 100.0 fL   MCH 30.4 26.0 - 34.0 pg   MCHC 34.1 30.0 - 36.0 g/dL   RDW 93.8 18.2 - 99.3 %   Platelets 265 150 - 400 K/uL   nRBC 0.0 0.0 - 0.2 %   Neutrophils Relative % 75 %   Neutro Abs 6.4 1.7 - 7.7 K/uL   Lymphocytes Relative 16 %   Lymphs Abs 1.4 0.7 - 4.0 K/uL   Monocytes Relative 9 %   Monocytes Absolute 0.8 0.1 - 1.0 K/uL   Eosinophils Relative 0 %   Eosinophils Absolute 0.0 0.0 - 0.5 K/uL   Basophils Relative 0 %   Basophils Absolute  0.0 0.0 - 0.1 K/uL   Immature Granulocytes 0 %   Abs Immature Granulocytes 0.02 0.00 - 0.07 K/uL    Comment: Performed at [facility], [place] [address] [place], [place] [address]  Magnesium     Status: None   Collection Time: 02/08/22  9:38 AM  Result Value Ref Range   Magnesium 2.0 1.7 - 2.4 mg/dL    Comment: Performed at [facility] [address], [place], [place] [address]  I-Stat Beta hCG blood, ED ([facility], [facility], [facility] only)     Status: None   Collection Time: 02/08/22 10:08 AM  Result Value Ref Range   I-stat hCG, quantitative <5.0 <5 mIU/mL   Comment 3            Comment:   GEST. AGE      CONC.  (mIU/mL)   <=1 WEEK        5 - 50     2 WEEKS       50 - 500     3 WEEKS       100 - 10,000     4 WEEKS     1,000 - 30,000        FEMALE AND NON-PREGNANT FEMALE:     LESS THAN 5 mIU/mL     I have discussed care with ER At this point special care provider has recommended transfer to tertiary care center  We are unable to provide adequate care for the patient at [facility] due to the lack of subspecialty support.  As patient is stable and no nursing needs currently do not exceed what ER can provide recommend continuation to observe  in the emergency department and have the patient follow-up with dentist in a.m. as planned please call back to tried hospitalist if any other questions arise  Raylon Lamson 8:40 PM

## 2022-02-08 NOTE — ED Notes (Signed)
Patient appears to be resting comfortably at this time and reports no complaints

## 2022-02-08 NOTE — Consult Note (Signed)
Reason for Consult: Neck swelling Referring Physician: Kinya [name] is an 42 y.o. female.  HPI: This patient is a 42 year old female with a 5 to 6-day history of right-sided dental pain.  She was seen in the ER 3 days ago for this and discharged with lidocaine, Tylenol, ibuprofen, and dental follow-up.  She came back to the ED today with similar complaints.  She denies fevers or chills.  She had a dental appointment today at 1130 but elected to come here instead.  Past Medical History:  Diagnosis Date   Herpes genitalia    Hypertension    Migraines     Past Surgical History:  Procedure Laterality Date   TUBAL LIGATION      Family History  Problem Relation Age of Onset   Diabetes Mother     Social History:  reports that she has never smoked. She has never used smokeless tobacco. She reports that she does not drink alcohol and does not use drugs.  Allergies: No Known Allergies  Medications: I have reviewed the patient's current medications.  Results for orders placed or performed during the hospital encounter of 02/08/22 (from the past 48 hour(s))  Comprehensive metabolic panel     Status: Abnormal   Collection Time: 02/08/22  9:38 AM  Result Value Ref Range   Sodium 135 135 - 145 mmol/L   Potassium 3.2 (L) 3.5 - 5.1 mmol/L   Chloride 104 98 - 111 mmol/L   CO2 21 (L) 22 - 32 mmol/L   Glucose, Bld 86 70 - 99 mg/dL    Comment: Glucose reference range applies only to samples taken after fasting for at least 8 hours.   BUN 7 6 - 20 mg/dL   Creatinine, Ser 0.81 0.44 - 1.00 mg/dL   Calcium 8.8 (L) 8.9 - 10.3 mg/dL   Total Protein 7.7 6.5 - 8.1 g/dL   Albumin 3.6 3.5 - 5.0 g/dL   AST 25 15 - 41 U/L   ALT 22 0 - 44 U/L   Alkaline Phosphatase 41 38 - 126 U/L   Total Bilirubin 1.1 0.3 - 1.2 mg/dL   GFR, Estimated >60 >60 mL/min    Comment: (NOTE) Calculated using the CKD-EPI Creatinine Equation (2021)    Anion gap 10 5 - 15    Comment: Performed at [facility] [address] [place], [place] [address]  CBC with Differential     Status: Abnormal   Collection Time: 02/08/22  9:38 AM  Result Value Ref Range   WBC 8.6 4.0 - 10.5 K/uL   RBC 3.81 (L) 3.87 - 5.11 MIL/uL   Hemoglobin 11.6 (L) 12.0 - 15.0 g/dL   HCT 34.0 (L) 36.0 - 46.0 %   MCV 89.2 80.0 - 100.0 fL   MCH 30.4 26.0 - 34.0 pg   MCHC 34.1 30.0 - 36.0 g/dL   RDW 12.9 11.5 - 15.5 %   Platelets 265 150 - 400 K/uL   nRBC 0.0 0.0 - 0.2 %   Neutrophils Relative % 75 %   Neutro Abs 6.4 1.7 - 7.7 K/uL   Lymphocytes Relative 16 %   Lymphs Abs 1.4 0.7 - 4.0 K/uL   Monocytes Relative 9 %   Monocytes Absolute 0.8 0.1 - 1.0 K/uL   Eosinophils Relative 0 %   Eosinophils Absolute 0.0 0.0 - 0.5 K/uL   Basophils Relative 0 %   Basophils Absolute 0.0 0.0 - 0.1 K/uL   Immature Granulocytes 0 %  Abs Immature Granulocytes 0.02 0.00 - 0.07 K/uL    Comment: Performed at [facility], [place] [address] [place], [place] [address]  Magnesium     Status: None   Collection Time: 02/08/22  9:38 AM  Result Value Ref Range   Magnesium 2.0 1.7 - 2.4 mg/dL    Comment: Performed at [facility] [address] [place], [place] [address]  I-Stat Beta hCG blood, ED ([facility], [facility], [facility] only)     Status: None   Collection Time: 02/08/22 10:08 AM  Result Value Ref Range   I-stat hCG, quantitative <5.0 <5 mIU/mL   Comment 3            Comment:   GEST. AGE      CONC.  (mIU/mL)   <=1 WEEK        5 - 50     2 WEEKS       50 - 500     3 WEEKS       100 - 10,000     4 WEEKS     1,000 - 30,000        FEMALE AND NON-PREGNANT FEMALE:     LESS THAN 5 mIU/mL     CT Maxillofacial W Contrast  Result Date: 02/08/2022 CLINICAL DATA:  Facial pain. "Bad tooth on bottom for a month". Four days of facial swelling and difficulty opening mouth. EXAM: CT MAXILLOFACIAL WITH CONTRAST TECHNIQUE: Multidetector CT imaging of the maxillofacial structures was performed with intravenous contrast. Multiplanar CT image  reconstructions were also generated. RADIATION DOSE REDUCTION: This exam was performed according to the departmental dose-optimization program which includes automated exposure control, adjustment of the mA and/or kV according to patient size and/or use of iterative reconstruction technique. CONTRAST:  41mL OMNIPAQUE IOHEXOL 350 MG/ML SOLN COMPARISON:  None Available. FINDINGS: Osseous: Extensive dental caries and periapical lucency of the left mandibular second molar. Dental caries involving the right mandibular third molar. Orbits: Negative. No traumatic or inflammatory finding. Sinuses: Mild mucosal thickening and frothy secretions within the right maxillary sinus. Soft tissues: Extensive subcutaneous edema with skin thickening in swelling of the right masseter and pterygoid muscles. Surrounding reactive appearing lymphadenopathy and subcutaneous fat stranding extending along the mandible and platysma. No abscess. Limited intracranial: No significant or unexpected finding. IMPRESSION: 1. Right facial cellulitis with likely reactive right masseter and pterygoid myositis. No abscess. 2. 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 Electronically Signed   By: Emmit Alexanders M.D   On: 02/08/2022 13:05    Review of Systems Blood pressure (!) 142/96, pulse 91, temperature 98.2 F (36.8 C), temperature source Oral, resp. rate 15, height 5\' 7"  (1.702 m), weight 63.5 kg, last menstrual period 02/07/2022, SpO2 100 %. Physical Exam  Patient is lying in the hallway in no acute distress but in some discomfort.  She points to her right jaw as the source of her pain Face is atraumatic without bony step-offs or other abnormalities Pupils are equal round and reactive to light with conjugate gaze Cranial nerves intact Floor of mouth is normal Nose normal Pinna normal without mastoid tenderness Right-sided neck swelling without fluctuance but warmth to  the touch Point tenderness overlying the next to last molar on the right side mandible Chest symmetric expansions bilaterally without use of accessory muscles  CT scan -I reviewed the report and actual films from her scan.  She has an obvious dental issue involving the  mandibular molar on the right side.  Although there is significant soft tissue edema involving the right side of the neck, there is no drainable abscess at this time.  I see no free air in the neck.  Assessment/Plan:  Odontogenic infection  Although there is no abscess present at this time requiring no drainage, this tooth needs to be removed and the patient started on antibiotics immediately.  The patient needs to be seen by oral surgery or dentistry today for removal of the tooth and management of this infection.  I would recommend against admitting the patient for IV antibiotics alone in absence of tooth removal and dental consult.  If the tooth is removed and the patient treated with antibiotics promptly, there is a good chance this will resolve without more aggressive surgery or life-threatening infection.  Given this reality, I would consider transfer to another facility if dental services or not promptly available here at [facility].  From an ENT standpoint, there is no airway issue or drainable abscess at this time in the neck.  Gina Brock Jr. 02/08/2022, 4:59 PM

## 2022-02-08 NOTE — ED Provider Notes (Signed)
Patient signed out to me at 1600 by Dr. Doren Custard pending disposition.  In short this is a 42 year old female who presented to the emergency department with dental pain and facial swelling.  She had trismus on exam and had a CT maxillofacial performed that showed evidence of dental disease with cellulitis, no evidence of abscess or deep space infection.  She was evaluated by ENT here as well as spoke with on-call dental.  Dental is unable to treat the patient in the hospital and ENT recommended transfer to a facility for inpatient oral surgery management due to her significant swelling and concern for airway compromise.  The patient has no drainable fluid collections at this time.  Upon my evaluation, the patient is awake and alert in no acute respiratory distress.  She is tolerating her secretions and has normal phonation.  She has received IV antibiotics and steroids.  We initially called [facility] as this was her preferred hospital and they do not have an on-call oral surgeon.  We will attempt [facility] or [facility].  Clinical Course as of 02/08/22 2245  Tue Feb 08, 2022  1900 [facility] is not accepting surgical patients at this time. [facility] will be called to attempt transfer. [VK]  1904 [facility] is not accepting transfers at this time. We will call on call dental for possible ED obs overnight with discharge to dental in the morning when their office is open. [VK]  1908 I spoke with Dr. Mariel Sleet of dental who states that he recommends continued antibiotics and to have the patient call their office first thing in the morning to be seen. Patient will be observed overnight and if she has no worsening of her swelling she can be discharged in the morning to the dental clinic. [VK]  2034 I spoke with Dr. Roel Cluck, hospitalist, who states the patient does not meet admission criteria and recommends overnight ED observation [VK]    Clinical Course User Index [VK] Kemper Durie, DO      Kemper Durie, DO 02/08/22 2245

## 2022-02-08 NOTE — ED Provider Notes (Signed)
[facility] EMERGENCY DEPARTMENT Provider Note   CSN: [id number] Arrival date & time: 02/08/22  4431     History  Chief Complaint  Patient presents with   Dental Pain   Abscess    Gina [name] is a 42 y.o. female.   Dental Pain Associated symptoms: facial swelling   Abscess Patient presents for dental pain and facial swelling.  Medical history includes HTN, migraine headaches, anxiety.  She was seen in the ED 3 days ago for right-sided dental pain.  She was discharged with directions for lidocaine solution, Tylenol, ibuprofen, and dental follow-up.  Over the past 2 days, she has developed painful swelling to the right mandibular area.  She reports that she has been tasting purulent drainage.  She denies fevers or chills.  She has had some nausea.  She reports that she does have a dentist appointment scheduled for 11:30 AM today.     Home Medications Prior to Admission medications   Medication Sig Start Date End Date Taking? Authorizing Provider  amoxicillin-clavulanate (AUGMENTIN) 875-125 MG tablet Take 1 tablet by mouth every 12 (twelve) hours. 02/08/22  Yes Gloris Manchester, MD  acetaminophen (TYLENOL) 500 MG tablet Take 1 tablet (500 mg total) by mouth every 6 (six) hours as needed. 02/05/22   Redwine, Madison A, PA-C  ARIPiprazole (ABILIFY) 10 MG tablet Take 10 mg by mouth daily.    [provider]  aspirin-acetaminophen-caffeine (EXCEDRIN MIGRAINE) [phone] MG tablet Take 3 tablets by mouth at bedtime as needed for migraine.    [provider]  hydrOXYzine (VISTARIL) 25 MG capsule Take 25 mg by mouth daily at 8 pm.    [provider]  ibuprofen (ADVIL) 800 MG tablet Take 1 tablet (800 mg total) by mouth 3 (three) times daily. 02/05/22   Redwine, Madison A, PA-C  lidocaine (XYLOCAINE) 2 % solution Use as directed 15 mLs in the mouth or throat as needed for mouth pain. 02/05/22   Redwine, Madison A, PA-C  naproxen (NAPROSYN) 500 MG tablet  Take 1 tablet (500 mg total) by mouth 2 (two) times daily. 10/28/17   Wurst, [place], PA-C  ondansetron (ZOFRAN) 4 MG tablet Take 1 tablet (4 mg total) by mouth every 6 (six) hours. 11/02/19   Couture, Cortni S, PA-C  potassium chloride (KLOR-CON) 10 MEQ tablet Take 1 tablet (10 mEq total) by mouth daily for 4 days. 11/02/19 11/06/19  Couture, Cortni S, PA-C      Allergies    Patient has no known allergies.    Review of Systems   Review of Systems  HENT:  Positive for dental problem and facial swelling.   All other systems reviewed and are negative.   Physical Exam Updated Vital Signs BP (!) 130/91 (BP Location: Left Arm)   Pulse 83   Temp 98 F (36.7 C) (Oral)   Resp 17   Ht 5\' 7"  (1.702 m)   Wt 63.5 kg   LMP 02/07/2022   SpO2 100%   BMI 21.93 kg/m  Physical Exam Vitals and nursing note reviewed.  Constitutional:      General: She is not in acute distress.    Appearance: She is well-developed. She is not ill-appearing, toxic-appearing or diaphoretic.  HENT:     Head: Normocephalic and atraumatic.     Right Ear: External ear normal.     Left Ear: External ear normal.     Nose: Nose normal.     Mouth/Throat:     Mouth: Mucous membranes  are moist.     Comments: 1 finger trismus.  Firm swelling to area of right angle of mandible, extending to semitubular area.  No sublingual edema. Eyes:     Conjunctiva/sclera: Conjunctivae normal.  Cardiovascular:     Rate and Rhythm: Normal rate and regular rhythm.     Heart sounds: No murmur heard. Pulmonary:     Effort: Pulmonary effort is normal. No respiratory distress.  Abdominal:     General: There is no distension.     Palpations: Abdomen is soft.  Musculoskeletal:        General: No swelling. Normal range of motion.     Cervical back: Neck supple.  Skin:    General: Skin is warm and dry.     Capillary Refill: Capillary refill takes less than 2 seconds.     Coloration: Skin is not jaundiced or pale.  Neurological:      General: No focal deficit present.     Mental Status: She is alert and oriented to person, place, and time.     Cranial Nerves: No cranial nerve deficit.     Sensory: No sensory deficit.     Motor: No weakness.     Coordination: Coordination normal.  Psychiatric:        Mood and Affect: Mood normal.        Behavior: Behavior normal.        Thought Content: Thought content normal.        Judgment: Judgment normal.     ED Results / Procedures / Treatments   Labs (all labs ordered are listed, but only abnormal results are displayed) Labs Reviewed  COMPREHENSIVE METABOLIC PANEL - Abnormal; Notable for the following components:      Result Value   Potassium 3.2 (*)    CO2 21 (*)    Calcium 8.8 (*)    All other components within normal limits  CBC WITH DIFFERENTIAL/PLATELET - Abnormal; Notable for the following components:   RBC 3.81 (*)    Hemoglobin 11.6 (*)    HCT 34.0 (*)    All other components within normal limits  MAGNESIUM  I-STAT BETA HCG BLOOD, ED ([facility], [facility], [facility] ONLY)    EKG None  Radiology No results found.  Procedures Procedures    Medications Ordered in ED Medications  potassium chloride SA (KLOR-CON M) CR tablet 40 mEq (has no administration in time range)  ondansetron (ZOFRAN) injection 4 mg (4 mg Intravenous Given 02/08/22 1019)  lactated ringers bolus 1,000 mL (1,000 mLs Intravenous New Bag/Given 02/08/22 1017)  ketorolac (TORADOL) 15 MG/ML injection 15 mg (15 mg Intravenous Given 02/08/22 1020)  oxyCODONE-acetaminophen (PERCOCET/ROXICET) 5-325 MG per tablet 1 tablet (1 tablet Oral Given 02/08/22 1016)  amoxicillin-clavulanate (AUGMENTIN) 875-125 MG per tablet 1 tablet (1 tablet Oral Given 02/08/22 1016)    ED Course/ Medical Decision Making/ A&P                           Medical Decision Making Amount and/or Complexity of Data Reviewed Labs: ordered. Radiology: ordered.  Risk Prescription drug management.   This patient presents to the ED for concern of  facial swelling, this involves an extensive number of treatment options, and is a complaint that carries with it a high risk of complications and morbidity.  The differential diagnosis includes odontogenic infection, parotitis, Ludwig's angina   Co morbidities that complicate the patient evaluation  HTN, migraine headaches, anxiety   Additional history obtained:  Additional history obtained from N/A External records from outside source obtained and reviewed including EMR   Lab Tests:  I Ordered, and personally interpreted labs.  The pertinent results include:  no leukocytosis, slight drop in hemoglobin when compared to lab work from a year ago.  Mild hypokalemia and hypocalcemia with otherwise normal electrolytes.   Problem List / ED Course / Critical interventions / Medication management  Patient presents for ongoing dental pain in addition to facial swelling over the last 2 days.  Additional symptoms include nausea.  He denies fevers or chills.  Vital signs normal on arrival in the ED.  On exam, patient is noted to have some swelling to area of right angle mandible.  This area is tender.  No fluctuance is appreciated.  Patient has 1 finger trismus that was improved after pain medication.  She does not have any sublingual edema.  External swelling extends to submandibular area. Patient was ordered Percocet and Toradol for analgesia.  Augmentin was ordered for odontogenic infection.  CT scan was ordered.  Lab work was reassuring.  Her potassium was slightly low and replacement was ordered.  CT scan showed right facial cellulitis with adjacent myositis.  No fluid collection was identified.  I spoke with otolaryngologist on-call, Dr. Marcelline Deist, who came and evaluated the patient.  He agrees that there is concern of worsening submandibular swelling.  He feels that she needs dentistry for source control.  I spoke with the dentist on-call, Dr. Mariel Sleet.  Dr. Mariel Sleet stated that he would be able to  treat her in his office but he would be unable to come to the hospital.  Given the aggressive course of the significant swelling that she presented with today, further calls to be made for emergent dental office visit tomorrow versus transfer for inpatient management.  Care of patient was signed out to oncoming ED provider. I ordered medication including IV fluid for hydration, Toradol and Percocet for analgesia; Zofran for nausea; Augmentin for odontogenic infection Reevaluation of the patient after these medicines showed that the patient improved I have reviewed the patients home medicines and have made adjustments as needed   Social Determinants of Health:  Does not have a PCP        Final Clinical Impression(s) / ED Diagnoses Final diagnoses:  Dental infection    Rx / DC Orders ED Discharge Orders          Ordered    amoxicillin-clavulanate (AUGMENTIN) 875-125 MG tablet  Every 12 hours        02/08/22 1053              Godfrey Pick, MD 02/08/22 1757

## 2022-02-08 NOTE — ED Notes (Signed)
Provider made aware of pts increased pain.

## 2022-02-09 NOTE — ED Notes (Signed)
Pt continues to sleep in her assigned hallway bed awaiting disposition later this morning. IV ABX unasyn started per MD order. Pt made aware and verbalized understanding of plan of care. Denies any additional needs.

## 2022-02-09 NOTE — ED Notes (Signed)
Discharge instructions provided by EDP were reinforced to pt. Follow up dental information and prescription provided to pt. Pt verbalized understanding with no questions at this time.

## 2022-02-09 NOTE — ED Provider Notes (Signed)
  Physical Exam  BP 138/78   Pulse 94   Temp 98.9 F (37.2 C) (Oral)   Resp 16   Ht 5\' 7"  (1.702 m)   Wt 63.5 kg   LMP 02/07/2022   SpO2 99%   BMI 21.93 kg/m   Physical Exam  Procedures  Procedures  ED Course / MDM   Clinical Course as of 02/09/22 0001  Tue Feb 08, 2022  1900 [facility] is not accepting surgical patients at this time. [facility] will be called to attempt transfer. [VK]  1904 [facility] is not accepting transfers at this time. We will call on call dental for possible ED obs overnight with discharge to dental in the morning when their office is open. [VK]  1908 I spoke with Dr. Mariel Sleet of dental who states that he recommends continued antibiotics and to have the patient call their office first thing in the morning to be seen. Patient will be observed overnight and if she has no worsening of her swelling she can be discharged in the morning to the dental clinic. [VK]  2034 I spoke with Dr. Roel Cluck, hospitalist, who states the patient does not meet admission criteria and recommends overnight ED observation [VK]  2359 Presented with dental caries and cellulitis. Has trismus and pain. CT with no drainable abscess. ENT recommended observe overnight. Multiple facilities with OMFS declined. Patient to be discharged in morning after observation and she should call office to be seen. [VB]    Clinical Course User Index [VB] Elgie Congo, MD [VK] [place], [place] Decision Making Amount and/or Complexity of Data Reviewed Labs: ordered. Radiology: ordered.  Risk Prescription drug management.   ***

## 2022-02-09 NOTE — ED Notes (Signed)
EDP Dr Nechama Guard at bedside

## 2022-06-09 ENCOUNTER — Encounter (HOSPITAL_COMMUNITY): Payer: Self-pay

## 2022-06-09 ENCOUNTER — Other Ambulatory Visit: Payer: Self-pay

## 2022-06-09 ENCOUNTER — Emergency Department (HOSPITAL_COMMUNITY)
Admission: EM | Admit: 2022-06-09 | Discharge: 2022-06-10 | Disposition: A | Discharge status: Home / Self Care | Payer: 59 | Attending: Emergency Medicine | Admitting: Emergency Medicine

## 2022-06-09 DIAGNOSIS — R0602 Shortness of breath: Secondary | ICD-10-CM | POA: Diagnosis not present

## 2022-06-09 DIAGNOSIS — R079 Chest pain, unspecified: Secondary | ICD-10-CM | POA: Diagnosis not present

## 2022-06-09 DIAGNOSIS — G4489 Other headache syndrome: Secondary | ICD-10-CM | POA: Diagnosis not present

## 2022-06-09 DIAGNOSIS — R519 Headache, unspecified: Secondary | ICD-10-CM | POA: Insufficient documentation

## 2022-06-09 DIAGNOSIS — R42 Dizziness and giddiness: Secondary | ICD-10-CM | POA: Diagnosis not present

## 2022-06-09 DIAGNOSIS — I10 Essential (primary) hypertension: Secondary | ICD-10-CM | POA: Diagnosis not present

## 2022-06-09 DIAGNOSIS — R0789 Other chest pain: Secondary | ICD-10-CM | POA: Diagnosis not present

## 2022-06-09 NOTE — ED Provider Notes (Signed)
[place] EMERGENCY DEPARTMENT AT [facility] Provider Note   CSN: [id number] Arrival date & time: 06/09/22  2311     History  Chief Complaint  Patient presents with   Chest Pain    Gina [name] is a 42 y.o. female.  Patient presents to the emergency department for evaluation of chest pain and shortness of breath.  Patient reports that symptoms began at 8 PM.  She got up out of bed and walk to the bathroom when she noticed the symptoms.  Patient reports pain in the center of her chest with shortness of breath.  Patient has a posterior headache as well.  She feels dizzy.  Nothing makes his symptoms better or worse.       Home Medications Prior to Admission medications   Medication Sig Start Date End Date Taking? Authorizing Provider  acetaminophen (TYLENOL) 500 MG tablet Take 1 tablet (500 mg total) by mouth every 6 (six) hours as needed. 02/05/22   Redwine, Madison A, PA-C  amoxicillin-clavulanate (AUGMENTIN) 875-125 MG tablet Take 1 tablet by mouth every 12 (twelve) hours. 02/08/22   Gloris Manchester, MD  ARIPiprazole (ABILIFY) 10 MG tablet Take 10 mg by mouth daily.    [provider]  aspirin-acetaminophen-caffeine (EXCEDRIN MIGRAINE) [phone] MG tablet Take 3 tablets by mouth at bedtime as needed for migraine.    [provider]  hydrOXYzine (VISTARIL) 25 MG capsule Take 25 mg by mouth daily at 8 pm.    [provider]  ibuprofen (ADVIL) 800 MG tablet Take 1 tablet (800 mg total) by mouth 3 (three) times daily. 02/05/22   Redwine, Madison A, PA-C  lidocaine (XYLOCAINE) 2 % solution Use as directed 15 mLs in the mouth or throat as needed for mouth pain. 02/05/22   Redwine, Madison A, PA-C  naproxen (NAPROSYN) 500 MG tablet Take 1 tablet (500 mg total) by mouth 2 (two) times daily. 10/28/17   Wurst, [place], PA-C  ondansetron (ZOFRAN) 4 MG tablet Take 1 tablet (4 mg total) by mouth every 6 (six) hours. 11/02/19   Couture, Cortni S, PA-C  potassium  chloride (KLOR-CON) 10 MEQ tablet Take 1 tablet (10 mEq total) by mouth daily for 4 days. 11/02/19 11/06/19  Couture, Cortni S, PA-C      Allergies    Patient has no known allergies.    Review of Systems   Review of Systems  Physical Exam Updated Vital Signs BP (!) 148/92   Pulse 64   Temp 98 F (36.7 C) (Oral)   Resp 16   Ht 5\' 7"  (1.702 m)   Wt 63.5 kg   LMP 06/02/2022 (Exact Date)   SpO2 98%   BMI 21.93 kg/m  Physical Exam Vitals and nursing note reviewed.  Constitutional:      General: She is not in acute distress.    Appearance: She is well-developed.  HENT:     Head: Normocephalic and atraumatic.     Mouth/Throat:     Mouth: Mucous membranes are moist.  Eyes:     General: Vision grossly intact. Gaze aligned appropriately.     Extraocular Movements: Extraocular movements intact.     Conjunctiva/sclera: Conjunctivae normal.  Cardiovascular:     Rate and Rhythm: Normal rate and regular rhythm.     Pulses: Normal pulses.     Heart sounds: Normal heart sounds, S1 normal and S2 normal. No murmur heard.    No friction rub. No gallop.  Pulmonary:     Effort: Pulmonary  effort is normal. No respiratory distress.     Breath sounds: Normal breath sounds.  Abdominal:     General: Bowel sounds are normal.     Palpations: Abdomen is soft.     Tenderness: There is no abdominal tenderness. There is no guarding or rebound.     Hernia: No hernia is present.  Musculoskeletal:        General: No swelling.     Cervical back: Full passive range of motion without pain, normal range of motion and neck supple. No spinous process tenderness or muscular tenderness. Normal range of motion.     Right lower leg: No edema.     Left lower leg: No edema.  Skin:    General: Skin is warm and dry.     Capillary Refill: Capillary refill takes less than 2 seconds.     Findings: No ecchymosis, erythema, rash or wound.  Neurological:     General: No focal deficit present.     Mental Status: She  is alert and oriented to person, place, and time.     GCS: GCS eye subscore is 4. GCS verbal subscore is 5. GCS motor subscore is 6.     Cranial Nerves: Cranial nerves 2-12 are intact.     Sensory: Sensation is intact.     Motor: Motor function is intact.     Coordination: Coordination is intact.  Psychiatric:        Attention and Perception: Attention normal.        Mood and Affect: Mood normal.        Speech: Speech normal.        Behavior: Behavior normal.     ED Results / Procedures / Treatments   Labs (all labs ordered are listed, but only abnormal results are displayed) Labs Reviewed  CBC WITH DIFFERENTIAL/PLATELET - Abnormal; Notable for the following components:      Result Value   RBC 3.71 (*)    Hemoglobin 10.7 (*)    HCT 31.8 (*)    All other components within normal limits  COMPREHENSIVE METABOLIC PANEL - Abnormal; Notable for the following components:   Potassium 3.2 (*)    Glucose, Bld 101 (*)    Calcium 8.6 (*)    Albumin 3.4 (*)    Alkaline Phosphatase 35 (*)    All other components within normal limits  BRAIN NATRIURETIC PEPTIDE  D-DIMER, QUANTITATIVE  TROPONIN I (HIGH SENSITIVITY)  TROPONIN I (HIGH SENSITIVITY)    EKG EKG Interpretation  Date/Time:  Thursday Jun 09 2022 23:18:02 EDT Ventricular Rate:  59 PR Interval:  175 QRS Duration: 99 QT Interval:  414 QTC Calculation: 411 R Axis:   0 Text Interpretation: Sinus rhythm Low voltage, precordial leads Nonspecific T abnormalities, diffuse leads No significant change since last tracing Confirmed by Gilda Crease [phone]) on 06/09/2022 11:22:20 PM  Radiology CT HEAD WO CONTRAST ( )  Result Date: 06/10/2022 CLINICAL DATA:  Dizziness EXAM: CT HEAD WITHOUT CONTRAST TECHNIQUE: Contiguous axial images were obtained from the base of the skull through the vertex without intravenous contrast. RADIATION DOSE REDUCTION: This exam was performed according to the departmental dose-optimization program  which includes automated exposure control, adjustment of the mA and/or kV according to patient size and/or use of iterative reconstruction technique. COMPARISON:  None Available. FINDINGS: Brain: No evidence of acute infarction, hemorrhage, hydrocephalus, extra-axial collection or mass lesion/mass effect. Vascular: No hyperdense vessel or unexpected calcification. Skull: Normal. Negative for fracture or focal lesion. Sinuses/Orbits: No acute  finding. Other: None. IMPRESSION: No acute intracranial abnormality noted. Electronically Signed   By: Alcide Clever M.D.   On: 06/10/2022 00:39   DG Chest Port 1 View  Result Date: 06/10/2022 CLINICAL DATA:  Chest pain. EXAM: PORTABLE CHEST 1 VIEW COMPARISON:  07/31/2020. FINDINGS: The heart size and mediastinal contours are within normal limits. Both lungs are clear. No acute osseous abnormality. IMPRESSION: No active disease. Electronically Signed   By: Thornell Sartorius M.D.   On: 06/10/2022 00:28    Procedures Procedures    Medications Ordered in ED Medications  oxyCODONE-acetaminophen (PERCOCET/ROXICET) 5-325 MG per tablet 1 tablet (1 tablet Oral Given 06/10/22 0111)  ibuprofen (ADVIL) tablet 600 mg (600 mg Oral Given 06/10/22 0111)    ED Course/ Medical Decision Making/ A&P                             Medical Decision Making Amount and/or Complexity of Data Reviewed Labs: ordered. Radiology: ordered.  Risk Prescription drug management.   Differential Diagnosis considered includes, but not limited to: STEMI; NSTEMI; myocarditis; pericarditis; pulmonary embolism; aortic dissection; pneumothorax; pneumonia; gastritis; musculoskeletal pain  Patient presents to the emergency department for evaluation of chest pain.  Patient developed chest pain approximately 2 hours before coming to the ED.  There is no exertional component.  Patient with minimal cardiac risk factors.  EKG without concerning features.  She has had 2 to high-sensitivity troponins that  are negative.  Additionally she has a normal D-dimer with no tachycardia, tachypnea, hypoxia or independent risk factors for PE.  Chest x-ray does not show any abnormality.  Patient has had a thorough workup of her chest pain and does not require hospitalization at this time.  Patient also complained of a mild posterior headache at arrival.  CT head was unremarkable.  She has a normal neurologic exam, no further workup necessary.        Final Clinical Impression(s) / ED Diagnoses Final diagnoses:  Chest pain, unspecified type    Rx / DC Orders ED Discharge Orders     None         Gilda Crease, MD 06/10/22 [phone]

## 2022-06-09 NOTE — ED Triage Notes (Signed)
Pt brought by EMS for sudden onset of midsternal CP at approx 2000 while walking. Pt also reports dizziness, SOB, and bilateral lower ext tingling. ASA 324 and NTH SL 0.4 administered PTA with no relief reported

## 2022-06-10 ENCOUNTER — Emergency Department (HOSPITAL_COMMUNITY): Payer: 59

## 2022-06-10 DIAGNOSIS — R079 Chest pain, unspecified: Secondary | ICD-10-CM | POA: Diagnosis not present

## 2022-06-10 DIAGNOSIS — R42 Dizziness and giddiness: Secondary | ICD-10-CM | POA: Diagnosis not present

## 2022-06-10 LAB — D-DIMER, QUANTITATIVE: D-Dimer, Quant: 0.31 ug/mL-FEU (ref 0.00–0.50)

## 2022-06-10 LAB — COMPREHENSIVE METABOLIC PANEL
ALT: 17 U/L (ref 0–44)
AST: 25 U/L (ref 15–41)
Albumin: 3.4 g/dL — ABNORMAL LOW (ref 3.5–5.0)
Alkaline Phosphatase: 35 U/L — ABNORMAL LOW (ref 38–126)
Anion gap: 9 (ref 5–15)
BUN: 12 mg/dL (ref 6–20)
CO2: 23 mmol/L (ref 22–32)
Calcium: 8.6 mg/dL — ABNORMAL LOW (ref 8.9–10.3)
Chloride: 103 mmol/L (ref 98–111)
Creatinine, Ser: 0.86 mg/dL (ref 0.44–1.00)
GFR, Estimated: >60 mL/min (ref >60)
Glucose, Bld: 101 mg/dL — ABNORMAL HIGH (ref 70–99)
Potassium: 3.2 mmol/L — ABNORMAL LOW (ref 3.5–5.1)
Sodium: 135 mmol/L (ref 135–145)
Total Bilirubin: 0.6 mg/dL (ref 0.3–1.2)
Total Protein: 7.1 g/dL (ref 6.5–8.1)

## 2022-06-10 LAB — CBC WITH DIFFERENTIAL/PLATELET
Abs Immature Granulocytes: 0.02 10*3/uL (ref 0.00–0.07)
Basophils Absolute: 0 10*3/uL (ref 0.0–0.1)
Basophils Relative: 1 %
Eosinophils Absolute: 0.2 10*3/uL (ref 0.0–0.5)
Eosinophils Relative: 4 %
HCT: 31.8 % — ABNORMAL LOW (ref 36.0–46.0)
Hemoglobin: 10.7 g/dL — ABNORMAL LOW (ref 12.0–15.0)
Immature Granulocytes: 0 %
Lymphocytes Relative: 51 %
Lymphs Abs: 2.6 10*3/uL (ref 0.7–4.0)
MCH: 28.8 pg (ref 26.0–34.0)
MCHC: 33.6 g/dL (ref 30.0–36.0)
MCV: 85.7 fL (ref 80.0–100.0)
Monocytes Absolute: 0.4 10*3/uL (ref 0.1–1.0)
Monocytes Relative: 8 %
Neutro Abs: 1.9 10*3/uL (ref 1.7–7.7)
Neutrophils Relative %: 36 %
Platelets: 289 10*3/uL (ref 150–400)
RBC: 3.71 MIL/uL — ABNORMAL LOW (ref 3.87–5.11)
RDW: 12.9 % (ref 11.5–15.5)
WBC: 5.2 10*3/uL (ref 4.0–10.5)
nRBC: 0 % (ref 0.0–0.2)

## 2022-06-10 LAB — TROPONIN I (HIGH SENSITIVITY)
Troponin I (High Sensitivity): 3 ng/L (ref <18)
Troponin I (High Sensitivity): 4 ng/L (ref <18)

## 2022-06-10 LAB — BRAIN NATRIURETIC PEPTIDE: B Natriuretic Peptide: 73.2 pg/mL (ref 0.0–100.0)

## 2022-06-10 MED ORDER — IBUPROFEN 400 MG PO TABS
600.0000 mg | ORAL_TABLET | Freq: Once | ORAL | Status: AC
  Administered 2022-06-10: 600 mg via ORAL
  Filled 2022-06-10: qty 1

## 2022-06-10 MED ORDER — OXYCODONE-ACETAMINOPHEN 5-325 MG PO TABS
1.0000 | ORAL_TABLET | Freq: Once | ORAL | Status: AC
  Administered 2022-06-10: 1 via ORAL
  Filled 2022-06-10: qty 1

## 2022-06-13 ENCOUNTER — Emergency Department (HOSPITAL_COMMUNITY)
Admission: EM | Admit: 2022-06-13 | Discharge: 2022-06-13 | Disposition: A | Discharge status: Home / Self Care | Payer: 59 | Attending: Emergency Medicine | Admitting: Emergency Medicine

## 2022-06-13 ENCOUNTER — Emergency Department (HOSPITAL_COMMUNITY): Payer: 59

## 2022-06-13 ENCOUNTER — Other Ambulatory Visit: Payer: Self-pay

## 2022-06-13 DIAGNOSIS — R519 Headache, unspecified: Secondary | ICD-10-CM | POA: Insufficient documentation

## 2022-06-13 DIAGNOSIS — R0789 Other chest pain: Secondary | ICD-10-CM | POA: Insufficient documentation

## 2022-06-13 DIAGNOSIS — R209 Unspecified disturbances of skin sensation: Secondary | ICD-10-CM | POA: Insufficient documentation

## 2022-06-13 DIAGNOSIS — R0602 Shortness of breath: Secondary | ICD-10-CM | POA: Diagnosis not present

## 2022-06-13 DIAGNOSIS — R42 Dizziness and giddiness: Secondary | ICD-10-CM | POA: Insufficient documentation

## 2022-06-13 DIAGNOSIS — I10 Essential (primary) hypertension: Secondary | ICD-10-CM | POA: Diagnosis not present

## 2022-06-13 DIAGNOSIS — R079 Chest pain, unspecified: Secondary | ICD-10-CM | POA: Diagnosis not present

## 2022-06-13 LAB — CBC
HCT: 31.5 % — ABNORMAL LOW (ref 36.0–46.0)
Hemoglobin: 10.4 g/dL — ABNORMAL LOW (ref 12.0–15.0)
MCH: 28.8 pg (ref 26.0–34.0)
MCHC: 33 g/dL (ref 30.0–36.0)
MCV: 87.3 fL (ref 80.0–100.0)
Platelets: 284 10*3/uL (ref 150–400)
RBC: 3.61 MIL/uL — ABNORMAL LOW (ref 3.87–5.11)
RDW: 12.9 % (ref 11.5–15.5)
WBC: 5.9 10*3/uL (ref 4.0–10.5)
nRBC: 0 % (ref 0.0–0.2)

## 2022-06-13 LAB — BASIC METABOLIC PANEL
Anion gap: 8 (ref 5–15)
BUN: 13 mg/dL (ref 6–20)
CO2: 24 mmol/L (ref 22–32)
Calcium: 9 mg/dL (ref 8.9–10.3)
Chloride: 104 mmol/L (ref 98–111)
Creatinine, Ser: 0.9 mg/dL (ref 0.44–1.00)
GFR, Estimated: >60 mL/min (ref >60)
Glucose, Bld: 148 mg/dL — ABNORMAL HIGH (ref 70–99)
Potassium: 3 mmol/L — ABNORMAL LOW (ref 3.5–5.1)
Sodium: 136 mmol/L (ref 135–145)

## 2022-06-13 LAB — TROPONIN I (HIGH SENSITIVITY): Troponin I (High Sensitivity): 3 ng/L (ref <18)

## 2022-06-13 MED ORDER — POTASSIUM CHLORIDE CRYS ER 20 MEQ PO TBCR
40.0000 meq | EXTENDED_RELEASE_TABLET | Freq: Once | ORAL | Status: AC
  Administered 2022-06-13: 40 meq via ORAL
  Filled 2022-06-13: qty 2

## 2022-06-13 MED ORDER — METOCLOPRAMIDE HCL 5 MG/ML IJ SOLN
10.0000 mg | Freq: Once | INTRAMUSCULAR | Status: AC
  Administered 2022-06-13: 10 mg via INTRAVENOUS
  Filled 2022-06-13: qty 2

## 2022-06-13 MED ORDER — DIPHENHYDRAMINE HCL 25 MG PO CAPS
25.0000 mg | ORAL_CAPSULE | Freq: Once | ORAL | Status: AC
  Administered 2022-06-13: 25 mg via ORAL
  Filled 2022-06-13: qty 1

## 2022-06-13 MED ORDER — HYDROXYZINE HCL 25 MG PO TABS: 25.0000 mg | ORAL_TABLET | Freq: Four times a day (QID) | ORAL | 0 refills | Status: AC

## 2022-06-13 MED ORDER — ACETAMINOPHEN 500 MG PO TABS
1000.0000 mg | ORAL_TABLET | Freq: Once | ORAL | Status: AC
  Administered 2022-06-13: 1000 mg via ORAL
  Filled 2022-06-13: qty 2

## 2022-06-13 NOTE — ED Provider Notes (Signed)
[place] [facility]ERGENCY DEPARTMENT AT [facility] Provider Note   CSN: [id number] Arrival date & time: 06/13/22  0005     History  Chief Complaint  Patient presents with   Headache   Chest Pain   Shortness of Breath    Gina [name] is a 42 y.o. female.   Headache Chest Pain Associated symptoms: headache and shortness of breath   Shortness of Breath Associated symptoms: chest pain and headaches    Patient is a 42 year old female with past medical history significant for headaches as well as migraines, hypertension, internal herpes  She is present emergency room today with complaints of headache, chest pain, shortness of breath and dizziness  Seems that she has had some episodic chest pain, bilateral hand tingling and sensations of dizziness that she describes as a spinning sensation as well as a chest pressure and some associated chest tightness that causes her to feel short of breath.  She states that her headache is occipital and severe.  She states that she does have occipital headaches occasionally her migraines are primarily one-sided, typical distribution headaches/migraines  She denies any radiation of her chest pain.  No recent surgeries, hospitalization, long travel, hemoptysis, estrogen containing OCP, cancer history.  No unilateral leg swelling.  No history of PE or VTE.      Home Medications Prior to Admission medications   Medication Sig Start Date End Date Taking? Authorizing Provider  Aspirin-Salicylamide-Caffeine (BC HEADACHE PO) Take 1 packet by mouth daily as needed (pain).   Yes [provider]  hydrOXYzine (ATARAX) 25 MG tablet Take 1 tablet (25 mg total) by mouth every 6 (six) hours for 21 doses. 06/13/22 06/19/22 Yes Smiley Birr, Stevphen Meuse S, PA  acetaminophen (TYLENOL) 500 MG tablet Take 1 tablet (500 mg total) by mouth every 6 (six) hours as needed. Patient not taking: Reported on 06/13/2022 02/05/22   Redwine, Madison A, PA-C   amoxicillin-clavulanate (AUGMENTIN) 875-125 MG tablet Take 1 tablet by mouth every 12 (twelve) hours. Patient not taking: Reported on 06/13/2022 02/08/22   Gloris Manchester, MD  ibuprofen (ADVIL) 800 MG tablet Take 1 tablet (800 mg total) by mouth 3 (three) times daily. Patient not taking: Reported on 06/13/2022 02/05/22   Redwine, Madison A, PA-C  lidocaine (XYLOCAINE) 2 % solution Use as directed 15 mLs in the mouth or throat as needed for mouth pain. Patient not taking: Reported on 06/13/2022 02/05/22   Redwine, Madison A, PA-C  naproxen (NAPROSYN) 500 MG tablet Take 1 tablet (500 mg total) by mouth 2 (two) times daily. Patient not taking: Reported on 06/13/2022 10/28/17   Wurst, [place], PA-C  ondansetron (ZOFRAN) 4 MG tablet Take 1 tablet (4 mg total) by mouth every 6 (six) hours. Patient not taking: Reported on 06/13/2022 11/02/19   Couture, Cortni S, PA-C  potassium chloride (KLOR-CON) 10 MEQ tablet Take 1 tablet (10 mEq total) by mouth daily for 4 days. 11/02/19 11/06/19  Couture, Cortni S, PA-C      Allergies    Cherry    Review of Systems   Review of Systems  Respiratory:  Positive for shortness of breath.   Cardiovascular:  Positive for chest pain.  Neurological:  Positive for headaches.    Physical Exam Updated Vital Signs BP (!) 152/103   Pulse 73   Temp 98.1 F (36.7 C) (Oral)   Resp 16   Ht 5\' 7"  (1.702 m)   Wt 108.9 kg   LMP 06/02/2022 (Exact Date)   SpO2 100%  BMI 37.59 kg/m  Physical Exam Vitals and nursing note reviewed.  Constitutional:      General: She is not in acute distress.    Comments: Pleasant 42 year old female in no acute distress.  Able answer questions.   HENT:     Head: Normocephalic and atraumatic.     Nose: Nose normal.     Mouth/Throat:     Mouth: Mucous membranes are moist.  Eyes:     General: No scleral icterus. Cardiovascular:     Rate and Rhythm: Normal rate and regular rhythm.     Pulses: Normal pulses.     Heart sounds: Normal heart sounds.   Pulmonary:     Effort: Pulmonary effort is normal. No respiratory distress.     Breath sounds: Normal breath sounds. No wheezing.  Abdominal:     Palpations: Abdomen is soft.     Tenderness: There is no abdominal tenderness. There is no guarding or rebound.     Comments: Abdomen soft nontender.  Tenderness or distention, soft  Musculoskeletal:     Cervical back: Normal range of motion.     Right lower leg: No edema.     Left lower leg: No edema.     Comments: No lower extremity edema, calves symmetric  Skin:    General: Skin is warm and dry.     Capillary Refill: Capillary refill takes less than 2 seconds.  Neurological:     Mental Status: She is alert. Mental status is at baseline.  Psychiatric:        Mood and Affect: Mood normal.        Behavior: Behavior normal.     ED Results / Procedures / Treatments   Labs (all labs ordered are listed, but only abnormal results are displayed) Labs Reviewed  BASIC METABOLIC PANEL - Abnormal; Notable for the following components:      Result Value   Potassium 3.0 (*)    Glucose, Bld 148 (*)    All other components within normal limits  CBC - Abnormal; Notable for the following components:   RBC 3.61 (*)    Hemoglobin 10.4 (*)    HCT 31.5 (*)    All other components within normal limits  TROPONIN I (HIGH SENSITIVITY)    EKG EKG Interpretation  Date/Time:  Monday Jun 13 2022 00:28:06 EDT Ventricular Rate:  72 PR Interval:  171 QRS Duration: 108 QT Interval:  378 QTC Calculation: 414 R Axis:   -2 Text Interpretation: Sinus rhythm Low voltage, precordial leads Nonspecific T abnormalities, diffuse leads No significant change was found Confirmed by Drema Pry [id number]) on 06/13/2022 1:44:17 AM  Radiology DG Chest Portable 1 View  Result Date: 06/13/2022 CLINICAL DATA:  Chest pain. EXAM: PORTABLE CHEST 1 VIEW COMPARISON:  Jun 10, 2022 FINDINGS: The heart size and mediastinal contours are within normal limits. Both lungs are  clear. The visualized skeletal structures are unremarkable. IMPRESSION: No active disease. Electronically Signed   By: Aram Candela M.D.   On: 06/13/2022 02:46    Procedures Procedures    Medications Ordered in ED Medications  potassium chloride SA (KLOR-CON M) CR tablet 40 mEq (40 mEq Oral Given 06/13/22 0219)  acetaminophen (TYLENOL) tablet 1,000 mg (1,000 mg Oral Given 06/13/22 0219)  metoCLOPramide (REGLAN) injection 10 mg (10 mg Intravenous Given 06/13/22 0219)  diphenhydrAMINE (BENADRYL) capsule 25 mg (25 mg Oral Given 06/13/22 0219)    ED Course/ Medical Decision Making/ A&P  Medical Decision Making Amount and/or Complexity of Data Reviewed Labs: ordered. Radiology: ordered.  Risk OTC drugs. Prescription drug management.    This patient presents to the ED for concern of CP, this involves a number of treatment options, and is a complaint that carries with it a moderate to high risk of complications and morbidity. A differential diagnosis was considered for the patient's symptoms which is discussed below:   The emergent causes of chest pain include: Acute coronary syndrome, tamponade, pericarditis/myocarditis, aortic dissection, pulmonary embolism, tension pneumothorax, pneumonia, and esophageal rupture.   I do not believe the patient has an emergent cause of chest pain, other urgent/non-acute considerations include, but are not limited to: chronic angina, aortic stenosis, cardiomyopathy, mitral valve prolapse, pulmonary hypertension, aortic insufficiency, right ventricular hypertrophy, pleuritis, bronchitis, pneumothorax, tumor, gastroesophageal reflux disease (GERD), esophageal spasm, Mallory-Weiss syndrome, peptic ulcer disease, pancreatitis, functional gastrointestinal pain, cervical or thoracic disk disease or arthritis, shoulder arthritis, costochondritis, subacromial bursitis, anxiety or panic attack, herpes zoster, breast disorders, chest  wall tumors, thoracic outlet syndrome, mediastinitis.    Co morbidities: Discussed in HPI   Brief History:  Patient is a 42 year old female with past medical history significant for headaches as well as migraines, hypertension, internal herpes  She is present emergency room today with complaints of headache, chest pain, shortness of breath and dizziness  Seems that she has had some episodic chest pain, bilateral hand tingling and sensations of dizziness that she describes as a spinning sensation as well as a chest pressure and some associated chest tightness that causes her to feel short of breath.  She states that her headache is occipital and severe.  She states that she does have occipital headaches occasionally her migraines are primarily one-sided, typical distribution headaches/migraines  She denies any radiation of her chest pain.  No recent surgeries, hospitalization, long travel, hemoptysis, estrogen containing OCP, cancer history.  No unilateral leg swelling.  No history of PE or VTE.     EMR reviewed including pt PMHx, past surgical history and past visits to ER.   See HPI for more details   Lab Tests:   I personally reviewed all laboratory work and imaging. Metabolic panel without any acute abnormality specifically kidney function within normal limits and no significant electrolyte abnormalities. CBC without leukocytosis or significant anemia. Troponin within normal limits x 1.  Been constant for greater than 6 hours, D-dimer from patient's last visit within the past week is negative patient is also PERC negative.  Imaging Studies:  NAD. I personally reviewed all imaging studies and no acute abnormality found. I agree with radiology interpretation.    Cardiac Monitoring:  The patient was maintained on a cardiac monitor.  I personally viewed and interpreted the cardiac monitored which showed an underlying rhythm of: NSR EKG non-ischemic   Medicines  ordered:  I ordered medication including Tylenol, Benadryl Reglan, potassium  Reevaluation of the patient after these medicines showed that the patient improved I have reviewed the patients home medicines and have made adjustments as needed   Critical Interventions:     Consults/Attending Physician      Reevaluation:  After the interventions noted above I re-evaluated patient and found that they have :improved   Social Determinants of Health:      Problem List / ED Course:  Patient with atypical chest pain has been had several workups recently noting reassuring negative D-dimer recent visit and negative here, low suspicion for PE, low heart score and very atypical chest pain have  low suspicion for ACS and troponin x 1 will hold normal troponins over the past 2 weeks. I do lengthy discussion with patient about her symptoms and she did indicate that she is experiencing quite a stress.  This coupled with her bilateral hand tingling and sensation of chest tightness and shortness of breath with normal lung sounds, no tachypnea or tachycardia and SpO2 100% on room air with normal chest x-ray certainly the possibility of anxiety as an etiology for her symptoms.  She indicates that this very well may be causing some of her symptoms.  I did recommend that she monitor and follow-up with primary care.  However we will go ahead and start her on hydroxyzine and a discussion about other medications that may be started or considered by her PCP to treat the symptoms.  Return precautions discussed including hemoptysis, worsening chest pain, any other new or concerning symptoms was discussed.    Dispostion:  After consideration of the diagnostic results and the patients response to treatment, I feel that the patent would benefit from discharge home with outpatient follow-up   Final Clinical Impression(s) / ED Diagnoses Final diagnoses:  Atypical chest pain    Rx / DC Orders ED Discharge  Orders          Ordered    hydrOXYzine (ATARAX) 25 MG tablet  Every 6 hours        06/13/22 0210              Gailen Shelter, [place] 06/13/22 0532    Nira Conn, MD 06/14/22 1142

## 2022-06-13 NOTE — Discharge Instructions (Addendum)
I have written you a prescription for hydroxyzine.  You may use this and follow-up with your primary care provider to further discuss your symptoms. Please keep an eye on your symptoms and return to emergency room for any new or concerning symptoms specifically including coughing up any blood or developing a fever.  However as we discussed, so your symptoms may be explained by the degree of anxiety.  Take the medicine I prescribed you as directed and follow-up with the [facility] wellness clinic.

## 2022-06-13 NOTE — ED Triage Notes (Addendum)
Pt arrives c/o pain to back of head, mid sternal chest tightness with associated SOB and dizziness over the past few days. Pt was seen for same symptoms on 5/9 but states that they are progressively worsening. No meds taken for pain. Denies cardiac hx, hx of blood clots. Pt expresses that she knows her body and fears something is wrong. Hx of migraines, but states that this pain is different than typical migraine.

## 2023-06-16 ENCOUNTER — Emergency Department (HOSPITAL_COMMUNITY)
Admission: EM | Admit: 2023-06-16 | Discharge: 2023-06-17 | Disposition: A | Discharge status: Other Acute Inpatient Hospital | Payer: MEDICAID | Attending: Emergency Medicine | Admitting: Emergency Medicine

## 2023-06-16 ENCOUNTER — Other Ambulatory Visit: Payer: Self-pay

## 2023-06-16 ENCOUNTER — Encounter (HOSPITAL_COMMUNITY): Payer: Self-pay | Admitting: Emergency Medicine

## 2023-06-16 ENCOUNTER — Emergency Department (EMERGENCY_DEPARTMENT_HOSPITAL): Payer: MEDICAID | Admitting: Registered Nurse

## 2023-06-16 ENCOUNTER — Emergency Department (HOSPITAL_COMMUNITY): Payer: MEDICAID

## 2023-06-16 ENCOUNTER — Emergency Department (HOSPITAL_COMMUNITY): Payer: MEDICAID | Admitting: Registered Nurse

## 2023-06-16 DIAGNOSIS — R220 Localized swelling, mass and lump, head: Secondary | ICD-10-CM

## 2023-06-16 DIAGNOSIS — I10 Essential (primary) hypertension: Secondary | ICD-10-CM | POA: Diagnosis not present

## 2023-06-16 DIAGNOSIS — R0689 Other abnormalities of breathing: Secondary | ICD-10-CM | POA: Diagnosis not present

## 2023-06-16 DIAGNOSIS — K047 Periapical abscess without sinus: Secondary | ICD-10-CM | POA: Diagnosis not present

## 2023-06-16 DIAGNOSIS — T884XXA Failed or difficult intubation, initial encounter: Secondary | ICD-10-CM

## 2023-06-16 DIAGNOSIS — E8721 Acute metabolic acidosis: Secondary | ICD-10-CM | POA: Diagnosis not present

## 2023-06-16 DIAGNOSIS — A419 Sepsis, unspecified organism: Secondary | ICD-10-CM | POA: Diagnosis not present

## 2023-06-16 DIAGNOSIS — K0889 Other specified disorders of teeth and supporting structures: Secondary | ICD-10-CM | POA: Diagnosis present

## 2023-06-16 LAB — CBC WITH DIFFERENTIAL/PLATELET
Abs Immature Granulocytes: 0.13 10*3/uL — ABNORMAL HIGH (ref 0.00–0.07)
Basophils Absolute: 0 10*3/uL (ref 0.0–0.1)
Basophils Relative: 0 %
Eosinophils Absolute: 0 10*3/uL (ref 0.0–0.5)
Eosinophils Relative: 0 %
HCT: 37 % (ref 36.0–46.0)
Hemoglobin: 12.2 g/dL (ref 12.0–15.0)
Immature Granulocytes: 1 %
Lymphocytes Relative: 9 %
Lymphs Abs: 1.5 10*3/uL (ref 0.7–4.0)
MCH: 28.4 pg (ref 26.0–34.0)
MCHC: 33 g/dL (ref 30.0–36.0)
MCV: 86 fL (ref 80.0–100.0)
Monocytes Absolute: 1.7 10*3/uL — ABNORMAL HIGH (ref 0.1–1.0)
Monocytes Relative: 11 %
Neutro Abs: 12.5 10*3/uL — ABNORMAL HIGH (ref 1.7–7.7)
Neutrophils Relative %: 79 %
Platelets: 362 10*3/uL (ref 150–400)
RBC: 4.3 MIL/uL (ref 3.87–5.11)
RDW: 13.4 % (ref 11.5–15.5)
WBC: 15.8 10*3/uL — ABNORMAL HIGH (ref 4.0–10.5)
nRBC: 0 % (ref 0.0–0.2)

## 2023-06-16 LAB — I-STAT CHEM 8, ED
BUN: 14 mg/dL (ref 6–20)
Calcium, Ion: 1.02 mmol/L — ABNORMAL LOW (ref 1.15–1.40)
Chloride: 106 mmol/L (ref 98–111)
Creatinine, Ser: 0.8 mg/dL (ref 0.44–1.00)
Glucose, Bld: 104 mg/dL — ABNORMAL HIGH (ref 70–99)
HCT: 38 % (ref 36.0–46.0)
Hemoglobin: 12.9 g/dL (ref 12.0–15.0)
Potassium: 3.3 mmol/L — ABNORMAL LOW (ref 3.5–5.1)
Sodium: 136 mmol/L (ref 135–145)
TCO2: 16 mmol/L — ABNORMAL LOW (ref 22–32)

## 2023-06-16 LAB — I-STAT CG4 LACTIC ACID, ED: Lactic Acid, Venous: 1.1 mmol/L (ref 0.5–1.9)

## 2023-06-16 LAB — BASIC METABOLIC PANEL WITH GFR
Anion gap: 20 — ABNORMAL HIGH (ref 5–15)
BUN: 15 mg/dL (ref 6–20)
CO2: 14 mmol/L — ABNORMAL LOW (ref 22–32)
Calcium: 9.4 mg/dL (ref 8.9–10.3)
Chloride: 100 mmol/L (ref 98–111)
Creatinine, Ser: 0.94 mg/dL (ref 0.44–1.00)
GFR, Estimated: >60 mL/min (ref >60)
Glucose, Bld: 97 mg/dL (ref 70–99)
Potassium: 3.2 mmol/L — ABNORMAL LOW (ref 3.5–5.1)
Sodium: 134 mmol/L — ABNORMAL LOW (ref 135–145)

## 2023-06-16 LAB — HCG, SERUM, QUALITATIVE: Preg, Serum: NEGATIVE

## 2023-06-16 MED ORDER — GLYCOPYRROLATE 0.2 MG/ML IJ SOLN
INTRAMUSCULAR | Status: AC
  Filled 2023-06-16: qty 1

## 2023-06-16 MED ORDER — VANCOMYCIN HCL 10 G IV SOLR
2000.0000 mg | Freq: Once | INTRAVENOUS | Status: AC
  Administered 2023-06-16: 2000 mg via INTRAVENOUS
  Filled 2023-06-16: qty 2000

## 2023-06-16 MED ORDER — PROPOFOL 1000 MG/100ML IV EMUL
INTRAVENOUS | Status: AC
  Administered 2023-06-17: 20 ug/kg/min via INTRAVENOUS
  Filled 2023-06-16: qty 100

## 2023-06-16 MED ORDER — KETAMINE HCL 50 MG/5ML IJ SOSY
PREFILLED_SYRINGE | INTRAMUSCULAR | Status: AC
  Administered 2023-06-16: 50 mg via INTRAVENOUS
  Filled 2023-06-16: qty 5

## 2023-06-16 MED ORDER — KETAMINE HCL 50 MG/5ML IJ SOSY: 1.0000 mg/kg | PREFILLED_SYRINGE | Freq: Once | INTRAMUSCULAR | Status: DC

## 2023-06-16 MED ORDER — SODIUM CHLORIDE 0.9 % IV BOLUS
1000.0000 mL | Freq: Once | INTRAVENOUS | Status: AC
  Administered 2023-06-16: 1000 mL via INTRAVENOUS

## 2023-06-16 MED ORDER — SODIUM CHLORIDE 0.9 % IV SOLN
3.0000 g | Freq: Four times a day (QID) | INTRAVENOUS | Status: DC
  Administered 2023-06-16 – 2023-06-17 (×3): 3 g via INTRAVENOUS
  Filled 2023-06-16 (×3): qty 8

## 2023-06-16 MED ORDER — KETAMINE HCL 50 MG/5ML IJ SOSY: 100.0000 mg | PREFILLED_SYRINGE | Freq: Once | INTRAMUSCULAR | Status: DC

## 2023-06-16 MED ORDER — KETAMINE HCL 50 MG/5ML IJ SOSY
PREFILLED_SYRINGE | INTRAMUSCULAR | Status: DC
Start: 2023-06-16 — End: 2023-06-17
  Filled 2023-06-16: qty 5

## 2023-06-16 MED ORDER — SUCCINYLCHOLINE CHLORIDE 200 MG/10ML IV SOSY
PREFILLED_SYRINGE | INTRAVENOUS | Status: AC
  Administered 2023-06-16: 100 mg via INTRAVENOUS
  Filled 2023-06-16: qty 10

## 2023-06-16 MED ORDER — SODIUM CHLORIDE 0.9 % IV SOLN: Freq: Once | INTRAVENOUS | Status: AC

## 2023-06-16 MED ORDER — IOHEXOL 350 MG/ML SOLN
75.0000 mL | Freq: Once | INTRAVENOUS | Status: AC | PRN
  Administered 2023-06-16: 75 mL via INTRAVENOUS

## 2023-06-16 NOTE — ED Notes (Signed)
 [facility] called for a possible oral surgery transfer, no beds open. [facility] was also called they stated they don't do oral surgery transfers. [facility] stated that no beds are open. [facility] accepted pt for possible transfer.

## 2023-06-16 NOTE — ED Notes (Signed)
 Pt transported to and from CT 2 with RN.

## 2023-06-16 NOTE — ED Notes (Signed)
 Preparing to intubate for airway safety.

## 2023-06-16 NOTE — ED Triage Notes (Addendum)
 Pt BIB EMS from home with right sided dental pain with right sided facial swelling x 1 week. Pt was seen here x 1 week ago for dizziness. Pt unable to open mouth.  160/100 120Hr 100RA

## 2023-06-16 NOTE — Code Documentation (Signed)
 50mg  ketamine given per MD.

## 2023-06-16 NOTE — ED Provider Notes (Signed)
 [place] EMERGENCY DEPARTMENT AT [facility] Provider Note   CSN: [id number] Arrival date & time: 06/16/23  2009     History  Chief Complaint  Patient presents with   Dental Pain    Gina [name] is a 43 y.o. female.  Patient here with facial swelling dental pain ongoing for the last 2 weeks worsening here the last couple days.  History of hypertension.  She is having a hard time opening her mouth.  She has no drooling and handling her secretions well.  But is gotten to the point where it is very difficult to open her mouth.  She has not been on any antibiotics.  No recent dental work.  She has no history of diabetes or immunocompromise state.  She denies any difficulty breathing no chest pain no weakness numbness tingling.  The history is provided by the patient.       Home Medications Prior to Admission medications   Medication Sig Start Date End Date Taking? Authorizing Provider  acetaminophen  (TYLENOL ) 500 MG tablet Take 1 tablet (500 mg total) by mouth every 6 (six) hours as needed. Patient not taking: Reported on 06/13/2022 02/05/22   Redwine, Madison A, PA-C  amoxicillin -clavulanate (AUGMENTIN ) 875-125 MG tablet Take 1 tablet by mouth every 12 (twelve) hours. Patient not taking: Reported on 06/13/2022 02/08/22   Iva Mariner, MD  Aspirin-Salicylamide-Caffeine  [facility] HEADACHE PO) Take 1 packet by mouth daily as needed (pain).    [provider]  ibuprofen  (ADVIL ) 800 MG tablet Take 1 tablet (800 mg total) by mouth 3 (three) times daily. Patient not taking: Reported on 06/13/2022 02/05/22   Redwine, Madison A, PA-C  lidocaine  (XYLOCAINE ) 2 % solution Use as directed 15 mLs in the mouth or throat as needed for mouth pain. Patient not taking: Reported on 06/13/2022 02/05/22   Redwine, Madison A, PA-C  naproxen  (NAPROSYN ) 500 MG tablet Take 1 tablet (500 mg total) by mouth 2 (two) times daily. Patient not taking: Reported on 06/13/2022 10/28/17   Wurst, [place], PA-C   ondansetron  (ZOFRAN ) 4 MG tablet Take 1 tablet (4 mg total) by mouth every 6 (six) hours. Patient not taking: Reported on 06/13/2022 11/02/19   Couture, Cortni S, PA-C  potassium chloride  (KLOR-CON ) 10 MEQ tablet Take 1 tablet (10 mEq total) by mouth daily for 4 days. 11/02/19 11/06/19  Couture, Cortni S, PA-C      Allergies    Cherry    Review of Systems   Review of Systems  Physical Exam Updated Vital Signs BP (!) 157/100   Pulse (!) 120   Temp 99 F (37.2 C)   Resp 16   Ht 5\' 7"  (1.702 m)   Wt 108.9 kg   SpO2 100%   BMI 37.60 kg/m  Physical Exam Vitals and nursing note reviewed.  Constitutional:      General: She is not in acute distress.    Appearance: She is well-developed. She is not ill-appearing.  HENT:     Head: Normocephalic and atraumatic.     Comments: 1 finger trismus, significant facial swelling over the right angle of the mandible into the right submandibular space, there is no stridor no drooling no secretions    Mouth/Throat:     Mouth: Mucous membranes are moist.  Eyes:     Extraocular Movements: Extraocular movements intact.     Conjunctiva/sclera: Conjunctivae normal.     Pupils: Pupils are equal, round, and reactive to light.  Cardiovascular:     Rate  and Rhythm: Normal rate and regular rhythm.     Pulses: Normal pulses.     Heart sounds: Normal heart sounds. No murmur heard. Pulmonary:     Effort: Pulmonary effort is normal. No respiratory distress.     Breath sounds: Normal breath sounds.  Abdominal:     Palpations: Abdomen is soft.     Tenderness: There is no abdominal tenderness.  Musculoskeletal:        General: No swelling.     Cervical back: Normal range of motion and neck supple.  Skin:    General: Skin is warm and dry.     Capillary Refill: Capillary refill takes less than 2 seconds.  Neurological:     Mental Status: She is alert.  Psychiatric:        Mood and Affect: Mood normal.     ED Results / Procedures / Treatments    Labs (all labs ordered are listed, but only abnormal results are displayed) Labs Reviewed  CBC WITH DIFFERENTIAL/PLATELET - Abnormal; Notable for the following components:      Result Value   WBC 15.8 (*)    Neutro Abs 12.5 (*)    Monocytes Absolute 1.7 (*)    Abs Immature Granulocytes 0.13 (*)    All other components within normal limits  BASIC METABOLIC PANEL WITH GFR - Abnormal; Notable for the following components:   Sodium 134 (*)    Potassium 3.2 (*)    CO2 14 (*)    Anion gap 20 (*)    All other components within normal limits  I-STAT CHEM 8, ED - Abnormal; Notable for the following components:   Potassium 3.3 (*)    Glucose, Bld 104 (*)    Calcium, Ion 1.02 (*)    TCO2 16 (*)    All other components within normal limits  HCG, SERUM, QUALITATIVE  I-STAT CG4 LACTIC ACID, ED  I-STAT CG4 LACTIC ACID, ED    EKG None  Radiology CT Soft Tissue Neck W Contrast Result Date: 06/16/2023 CLINICAL DATA:  Epiglottitis or tonsillitis suspected ludwigs concern EXAM: CT NECK WITH CONTRAST TECHNIQUE: Multidetector CT imaging of the neck was performed using the standard protocol following the bolus administration of intravenous contrast. RADIATION DOSE REDUCTION: This exam was performed according to the departmental dose-optimization program which includes automated exposure control, adjustment of the mA and/or kV according to patient size and/or use of iterative reconstruction technique. CONTRAST:  75mL OMNIPAQUE  IOHEXOL  350 MG/ML SOLN COMPARISON:  None Available. FINDINGS: Pharynx and larynx: The below process abuts the right aspect of the oropharynx. No visible mass. Salivary glands: The right submandibular gland is mildly edematous, likely reactive due to the process described below. Otherwise, unremarkable appearance of the submandibular glands and parotid glands. Thyroid : Normal. Lymph nodes: Mildly prominent upper right cervical chain lymph nodes, nonspecific but likely reactive given  the process described below. Vascular: Limited assessment due to non arterial timing. Major arteries appearing patent in the neck. Limited intracranial: Negative. Visualized orbits: Negative. Mastoids and visualized paranasal sinuses: Clear. Skeleton: No acute abnormality on limited assessment. Upper chest: Visualized lung apices are clear. Other: Large (5 x 3.6 x 4.9 cm) peripherally enhancing abscess surrounding the posterior right mandibular body and ramus, compatible with abscess. Surrounding edema in the submandibular region extending inferiorly into the upper right neck. Adjacent carious right posterior-most mandibular molar with periapical lucency. IMPRESSION: Large (5.0 X 3.6 x 4.9 cm) peripherally enhancing abscess surrounding the posterior right mandibular body and ramus, compatible with abscess.  Findings are likely odontogenic given location and adjacent carious right posterior-most mandibular molar with periapical lucency. Electronically Signed   By: Stevenson Elbe M.D.   On: 06/16/2023 23:11    Procedures .Critical Care  Performed by: Lowery Rue, DO Authorized by: Lowery Rue, DO   Critical care provider statement:    Critical care time (minutes):  45   Critical care was necessary to treat or prevent imminent or life-threatening deterioration of the following conditions: dental abscess, facial swelling, airway comprimise.   Critical care was time spent personally by me on the following activities:  Blood draw for specimens, discussions with primary provider, discussions with consultants, development of treatment plan with patient or surrogate, examination of patient, evaluation of patient's response to treatment, obtaining history from patient or surrogate, ordering and performing treatments and interventions, ordering and review of laboratory studies, ordering and review of radiographic studies, pulse oximetry, re-evaluation of patient's condition and review of old charts   Care  discussed with: admitting provider       Medications Ordered in ED Medications  Ampicillin -Sulbactam (UNASYN ) 3 g in sodium chloride  0.9 % 100 mL IVPB (0 g Intravenous Stopped 06/16/23 2138)  ketamine  50 mg in normal saline 5 mL (10 mg/mL) syringe (100 mg Intravenous Not Given 06/17/23 0005)  propofol  (DIPRIVAN ) 1000 MG/100ML infusion (20 mcg/kg/min  108.9 kg Intravenous New Bag/Given 06/17/23 0003)  fentaNYL  (SUBLIMAZE ) injection 50 mcg (has no administration in time range)  fentaNYL  in NS (10mcg/ml) infusion-PREMIX (has no administration in time range)  fentaNYL  (SUBLIMAZE ) bolus via infusion 50-100 mcg (has no administration in time range)  sodium chloride  0.9 % bolus 1,000 mL (0 mLs Intravenous Stopped 06/16/23 2253)  vancomycin  (VANCOCIN ) 2,000 mg in sodium chloride  0.9 % 500 mL IVPB (2,000 mg Intravenous New Bag/Given 06/16/23 2213)  iohexol  (OMNIPAQUE ) 350 MG/ML injection 75 mL (75 mLs Intravenous Contrast Given 06/16/23 2136)  sodium chloride  0.9 % bolus 1,000 mL (0 mLs Intravenous Stopped 06/16/23 2354)  0.9 %  sodium chloride  infusion ( Intravenous New Bag/Given 06/16/23 2354)  glycopyrrolate  (ROBINUL ) 0.2 MG/ML injection (  Given by Other 06/17/23 0000)  succinylcholine  (ANECTINE ) 200 MG/10ML syringe (100 mg Intravenous Given 06/16/23 2357)    ED Course/ Medical Decision Making/ A&P                                 Medical Decision Making Amount and/or Complexity of Data Reviewed Labs: ordered. Radiology: ordered.  Risk Prescription drug management.   Gina [name] is here with facial swelling dental pain.  Normal vitals.  No fever.  Significant facial swelling trismus with some mandibular swelling as well.  She has 1 finger trismus.  Significant swelling over the right angle of the jaw mostly around the masseter.  She is got swelling of the submandibular space as well.  But she has no stridor no drooling no secretions.  Differential diagnosis likely odontogenic  infection abscess.  Ludwig features but handling secretions, no stridor no imminent airway compromise.  Code medical initiated, broad IV antibiotics have been started CT of the face has been ordered.  I have reached out to ENT Dr. Virgia Griffins who states that if it is odontogenic he is not able to assist.  White count 15.8 bicarb 14 anion gap is 20 however blood sugar is 97.  Lactic acid is normal.  I suspect this could be a mild starvation ketosis.  Will give an  additional fluid bolus started on maintenance fluids.   CT per my review looks like there is a odontogenic abscess surrounding cellulitis awaiting radiology report.  11 pm Patient is a doing well.  Handling secretions well.  No stridor no respiratory distress.  There is been no advancement of her swelling.  I have talked with [facility] who is unable to accept the patient in transfer.  We reached out to [facility] who are unable to help out as well.  I talked with [facility] with Dr. Arlene Lacy and Dr. Idelia Maizes with emergency medicine team and oral surgery team respectively who are willing to accept the patient in an ED to ED transfer.  Overall CT report shows large 5 x 3.6 x 4.9 peripherally enhancing abscess around the posterior right mandible body and ramus compatible with an abscess likely odontogenic given the location and adjacent carious right posterior most mandibular molar.  Given her significant swelling and trismus submandibular swelling I discussed the case with anesthesia for them to come down and help with airway.  Ultimately since patient needs to be transferred over 2 hours and with her trismus if she developed any vomiting or worsening symptoms I think could be a very difficult airway and decision was made with anesthesia team to intubate prior to transfer.  I have made the [facility] team aware of this.  Overall anesthesia team came down to the ED, we are able to provide sedation with ketamine  and propofol  and were able to get  patient intubated with glide scope.  We had nasal airway as backup.  Patient tolerated intubation well by the anesthesia team.  Patient started on propofol  and fentanyl  for sedation.  Chest x-ray has been ordered.  Foley ordered.  Patient handed off to Dr. Carie Charity while patient is awaiting transport to bring him to [facility].  This chart was dictated using voice recognition software.  Despite best efforts to proofread,  errors can occur which can change the documentation meaning.         Final Clinical Impression(s) / ED Diagnoses Final diagnoses:  Dental infection  Facial swelling  Dental abscess    Rx / DC Orders ED Discharge Orders     None         Lowery Rue, DO 06/17/23 0014

## 2023-06-16 NOTE — Code Documentation (Signed)
 Anasthesia at bedside preparing patient for intubation. Suction available.  RT and MD at bedside as well.

## 2023-06-16 NOTE — Code Documentation (Signed)
Tube in place.

## 2023-06-16 NOTE — ED Notes (Signed)
 Pt reports 2 weeks of right jaw swelling that's gotten progressively worse. Started out as a dental pain. Subjective fevers. Airway intact. Reports having trouble opening her mouth wide enough to eat. Also reports her ear is hurting on the right as well.   Pt placed on the monitor. Suction available. Denies further needs.

## 2023-06-17 ENCOUNTER — Emergency Department (HOSPITAL_COMMUNITY): Payer: MEDICAID

## 2023-06-17 DIAGNOSIS — E876 Hypokalemia: Secondary | ICD-10-CM

## 2023-06-17 DIAGNOSIS — K047 Periapical abscess without sinus: Secondary | ICD-10-CM

## 2023-06-17 DIAGNOSIS — E8721 Acute metabolic acidosis: Secondary | ICD-10-CM | POA: Diagnosis not present

## 2023-06-17 DIAGNOSIS — A419 Sepsis, unspecified organism: Secondary | ICD-10-CM

## 2023-06-17 DIAGNOSIS — T884XXA Failed or difficult intubation, initial encounter: Secondary | ICD-10-CM | POA: Diagnosis not present

## 2023-06-17 LAB — I-STAT ARTERIAL BLOOD GAS, ED
Acid-base deficit: 3 mmol/L — ABNORMAL HIGH (ref 0.0–2.0)
Bicarbonate: 22.9 mmol/L (ref 20.0–28.0)
Calcium, Ion: 1.16 mmol/L (ref 1.15–1.40)
HCT: 30 % — ABNORMAL LOW (ref 36.0–46.0)
Hemoglobin: 10.2 g/dL — ABNORMAL LOW (ref 12.0–15.0)
O2 Saturation: 99 %
Patient temperature: 99.2
Potassium: 3.3 mmol/L — ABNORMAL LOW (ref 3.5–5.1)
Sodium: 141 mmol/L (ref 135–145)
TCO2: 24 mmol/L (ref 22–32)
pCO2 arterial: 42 mmHg (ref 32–48)
pH, Arterial: 7.346 — ABNORMAL LOW (ref 7.35–7.45)
pO2, Arterial: 150 mmHg — ABNORMAL HIGH (ref 83–108)

## 2023-06-17 MED ORDER — DOCUSATE SODIUM 50 MG/5ML PO LIQD: 100.0000 mg | Freq: Two times a day (BID) | ORAL | Status: DC

## 2023-06-17 MED ORDER — POTASSIUM CHLORIDE 10 MEQ/100ML IV SOLN
10.0000 meq | INTRAVENOUS | Status: DC
  Administered 2023-06-17: 10 meq via INTRAVENOUS
  Filled 2023-06-17: qty 100

## 2023-06-17 MED ORDER — FENTANYL BOLUS VIA INFUSION: 50.0000 ug | INTRAVENOUS | Status: DC | PRN

## 2023-06-17 MED ORDER — FENTANYL CITRATE PF 50 MCG/ML IJ SOSY
50.0000 ug | PREFILLED_SYRINGE | Freq: Once | INTRAMUSCULAR | Status: AC
  Administered 2023-06-17: 50 ug via INTRAVENOUS
  Filled 2023-06-17: qty 1

## 2023-06-17 MED ORDER — PROPOFOL 1000 MG/100ML IV EMUL
0.0000 ug/kg/min | INTRAVENOUS | Status: DC
  Administered 2023-06-17 (×3): 50 ug/kg/min via INTRAVENOUS
  Filled 2023-06-17 (×4): qty 100

## 2023-06-17 MED ORDER — LACTATED RINGERS IV SOLN: INTRAVENOUS | Status: DC

## 2023-06-17 MED ORDER — MAGNESIUM SULFATE 2 GM/50ML IV SOLN
2.0000 g | Freq: Once | INTRAVENOUS | Status: AC
  Administered 2023-06-17: 2 g via INTRAVENOUS
  Filled 2023-06-17: qty 50

## 2023-06-17 MED ORDER — ORAL CARE MOUTH RINSE: 15.0000 mL | OROMUCOSAL | Status: DC

## 2023-06-17 MED ORDER — FENTANYL CITRATE PF 50 MCG/ML IJ SOSY: 50.0000 ug | PREFILLED_SYRINGE | Freq: Once | INTRAMUSCULAR | Status: DC

## 2023-06-17 MED ORDER — FENTANYL 2500MCG IN NS 250ML (10MCG/ML) PREMIX INFUSION
50.0000 ug/h | INTRAVENOUS | Status: DC
  Administered 2023-06-17: 50 ug/h via INTRAVENOUS
  Filled 2023-06-17: qty 250

## 2023-06-17 MED ORDER — ORAL CARE MOUTH RINSE: 15.0000 mL | OROMUCOSAL | Status: DC | PRN

## 2023-06-17 MED ORDER — FENTANYL 2500MCG IN NS 250ML (10MCG/ML) PREMIX INFUSION
50.0000 ug/h | INTRAVENOUS | Status: DC
  Administered 2023-06-17: 100 ug/h via INTRAVENOUS

## 2023-06-17 MED ORDER — PANTOPRAZOLE SODIUM 40 MG IV SOLR: 40.0000 mg | Freq: Every day | INTRAVENOUS | Status: DC

## 2023-06-17 MED ORDER — FAMOTIDINE 20 MG PO TABS: 20.0000 mg | ORAL_TABLET | Freq: Two times a day (BID) | ORAL | Status: DC

## 2023-06-17 MED ORDER — POLYETHYLENE GLYCOL 3350 17 G PO PACK: 17.0000 g | PACK | Freq: Every day | ORAL | Status: DC

## 2023-06-17 NOTE — Consult Note (Signed)
 NAME:  Gina [name], MRN:  [id number], DOB:  Mar 05, 1980, LOS: 0 ADMISSION DATE:  06/16/2023, CONSULTATION DATE:  06/17/23 REFERRING MD:  Carylon Claude, CHIEF COMPLAINT:  Critical airway/Dental abscess    History of Present Illness:  Hx from chart review as pt is intubated/sedated at time of consultation   42 to F hx HTN presented to ED 5/16 night w CC dental pain + R facial swelling. Ongoing x at least 2 wks, worsening the last few days prompting ED presentation. Was unable to open her mouth. No recent dental work, but chart does reveal hx dental carries on R and multiple previous presentations in 2024 for R dental pain/carries/infection.   Started on broad abx in ED later narrowed to unasyn , imaging optained which revealed a large R mandibular abscess likely odontogenic etiology. Given dental etiology, ENT unable to assist. No dental surgery at [facility]. Efforts made to facilitate transfer to OSH by overnight EM team -- [facility] [place]. Was intubated for airway protection and later declined in [facility]xf to [facility] 5/17 morning.  PCCM is called in this setting    Chart reviewed Tachycardic in ED until she was intubated/sedated. Normotensive. Low grade temps. WBC 16. AGMA, low K, normal LA   Imaging personally reviewed -- CT w large R mandibular abscess, surrounding edema. On CXR ETT in appropriate position. No evidence of PNA.   Pertinent  Medical History  Dental carries/dental infection HTN  Migraine  Herpes   Significant Hospital Events: Including procedures, antibiotic start and stop dates in addition to other pertinent events   5/16 ED w dental abscess. Unasyn . Decl[facility] in [facility]. Int[facility]ated. [facility] fell through. PCCM called for admission -- facilitating txf to [facility] instead and will consult while in ED.   Interim History / Subjective:  Intubated overnight and transfer plans have fallen through    Objective    Blood pressure 111/78, pulse 86, temperature 99.2 F (37.3 C), resp. rate 12, height 5\' 7"  (1.702 m), weight 108.9 kg, SpO2 100%.    Vent Mode: PRVC FiO2 (%):  [40 %] 40 % Set Rate:  [14 bmp] 14 bmp Vt Set:  [490 mL] 490 mL PEEP:  [5 cmH20] 5 cmH20 Plateau Pressure:  [18 cmH20-19 cmH20] 19 cmH20   Intake/Output Summary (Last 24 hours) at 06/17/2023 0903 Last data filed at 06/16/2023 2354 Gross per 24 hour  Intake 1000 ml  Output --  Net 1000 ml   Filed Weights   06/16/23 2014  Weight: 108.9 kg    Examination: General: critically ill obese F intubated sedated NAD  HENT: ETT secure. Anicteric sclera. R facial and mandibular swelling extending down R neck Lungs: CTA, mechanically ventilated  Cardiovascular: rrr Abdomen: obese soft  Extremities: no obvious acute joint deformity  Neuro: sedated, RASS -3. Pupils are 2mm  reactive  GU: foley   Resolved problem list   Assessment and Plan    Critical/difficult airway, endotracheally intubated  Sepsis 2/2 large R mandibular abscess  AGMA due to above Hypokalemia   Difficult situation ---large mandibular abscess (presumed dental etiology) causing airway compromise & requiring intubation overnight.  For EM team, [facility] declined txf, [facility] to [facility] fell through.   We do not have OMFS / dental surgery services. I have spoken to ENT for general advice-- appreciate taking the time to discuss. Was reiterated that we do not have available services at [facility] and the  pt needs to be txf. EM d/w gen dental who reiterated same absence of services.   Dr Carylon Claude w EM called [facility] again to reiterate above absence of services, and need for transfer. I additionally spoke w [facility] txf center to reiterate the above-- OMFS is speaking w STICU team and will call back.   I also personally spoke with [facility] and [facility] who decline txf ([facility] offers OMFS for trauma only, [facility] cited no OMFS/dental services and to pursue private  practice options.Aaron Aas)  [facility] called Dr. Carylon Claude back and has accepted pt in txf to STICU   Plan -Txf to [facility] for operative intervention w OMFS . Appreciate our EM team's efforts in coordinating a safe plan for this pt.  -PCCM will consult while in ED, but will not admit to ICU here  -unasyn  -cont prop/fent -- incr range on fent gtt with goal RASS -3/4 given critical airway  -will get an ABG, adjust vent PRN -giving K as well as empiric mag for her hypoK -LR 50/hr  -repeat LA  -will order for repeat BMP at noon & further address lytes etc as needed  - SCDs for vte ppx  - fine to keep foley for now w expected surgical case once at [facility].     Best Practice (right click and "Reselect all SmartList Selections" daily)   Diet/type: NPO DVT prophylaxis SCD Pressure ulcer(s): N/A GI prophylaxis: PPI Lines: N/A Foley:  Yes, and it is still needed Code Status:  full code Last date of multidisciplinary goals of care discussion [--]  Labs   CBC: Recent Labs  Lab 06/16/23 2105 06/16/23 2112  WBC 15.8*  --   NEUTROABS 12.5*  --   HGB 12.2 12.9  HCT 37.0 38.0  MCV 86.0  --   PLT 362  --     Basic Metabolic Panel: Recent Labs  Lab 06/16/23 2105 06/16/23 2112  NA 134* 136  K 3.2* 3.3*  CL 100 106  CO2 14*  --   GLUCOSE 97 104*  BUN 15 14  CREATININE 0.94 0.80  CALCIUM 9.4  --    GFR: Estimated Creatinine Clearance: 116.4 mL/min (by C-G formula based on SCr of 0.8 mg/dL). Recent Labs  Lab 06/16/23 2105 06/16/23 2219  WBC 15.8*  --   LATICACIDVEN  --  1.1    Liver Function Tests: No results for input(s): "AST", "ALT", "ALKPHOS", "BILITOT", "PROT", "ALBUMIN" in the last 168 hours. No results for input(s): "LIPASE", "AMYLASE" in the last 168 hours. No results for input(s): "AMMONIA" in the last 168 hours.  ABG    Component Value Date/Time   TCO2 16 (L) 06/16/2023 2112     Coagulation Profile: No results for input(s): "INR", "PROTIME" in the last 168  hours.  Cardiac Enzymes: No results for input(s): "CKTOTAL", "CKMB", "CKMBINDEX", "TROPONINI" in the last 168 hours.  HbA1C: Hgb A1c MFr Bld  Date/Time Value Ref Range Status  08/23/2017 10:13 AM 5.8 (H) 4.8 - 5.6 % Final    Comment:             Prediabetes: 5.7 - 6.4          Diabetes: >6.4          Glycemic control for adults with diabetes: <7.0   01/05/2015 12:07 PM 5.6 <5.7 % Final    Comment:  According to the ADA Clinical Practice Recommendations for 2011, when HbA1c is used as a screening test:     >=6.5%   Diagnostic of Diabetes Mellitus            (if abnormal result is confirmed)   5.7-6.4%   Increased risk of developing Diabetes Mellitus   References:Diagnosis and Classification of Diabetes Mellitus,Diabetes Care,2011,34(Suppl 1):S62-S69 and Standards of Medical Care in         Diabetes - 2011,Diabetes Care,2011,34 (Suppl 1):S11-S61.       CBG: No results for input(s): "GLUCAP" in the last 168 hours.  Review of Systems:   Unable to obtain, intubated sedated   Past Medical History:  She,  has a past medical history of Herpes genitalia, Hypertension, and Migraines.   Surgical History:   Past Surgical History:  Procedure Laterality Date   TUBAL LIGATION       Social History:   reports that she has never smoked. She has never used smokeless tobacco. She reports that she does not drink alcohol and does not use drugs.   Family History:  Her family history includes Diabetes in her mother.   Allergies Allergies  Allergen Reactions   Cherry Hives     Home Medications  Prior to Admission medications   Medication Sig Start Date End Date Taking? Authorizing Provider  acetaminophen  (TYLENOL ) 500 MG tablet Take 1 tablet (500 mg total) by mouth every 6 (six) hours as needed. Patient not taking: Reported on 06/13/2022 02/05/22   Redwine, Madison A, PA-C  amoxicillin -clavulanate  (AUGMENTIN ) 875-125 MG tablet Take 1 tablet by mouth every 12 (twelve) hours. Patient not taking: Reported on 06/13/2022 02/08/22   Iva Mariner, MD  Aspirin-Salicylamide-Caffeine  [facility] HEADACHE PO) Take 1 packet by mouth daily as needed (pain).    [provider]  ibuprofen  (ADVIL ) 800 MG tablet Take 1 tablet (800 mg total) by mouth 3 (three) times daily. Patient not taking: Reported on 06/13/2022 02/05/22   Redwine, Madison A, PA-C  lidocaine  (XYLOCAINE ) 2 % solution Use as directed 15 mLs in the mouth or throat as needed for mouth pain. Patient not taking: Reported on 06/13/2022 02/05/22   Redwine, Madison A, PA-C  naproxen  (NAPROSYN ) 500 MG tablet Take 1 tablet (500 mg total) by mouth 2 (two) times daily. Patient not taking: Reported on 06/13/2022 10/28/17   Wurst, [place], PA-C  ondansetron  (ZOFRAN ) 4 MG tablet Take 1 tablet (4 mg total) by mouth every 6 (six) hours. Patient not taking: Reported on 06/13/2022 11/02/19   Couture, Cortni S, PA-C  potassium chloride  (KLOR-CON ) 10 MEQ tablet Take 1 tablet (10 mEq total) by mouth daily for 4 days. 11/02/19 11/06/19  Couture, Cortni S, PA-C     Critical care time: 77 min      CRITICAL CARE Performed by: Delories Fetter   Total critical care time: 77 minutes  Critical care time was exclusive of separately billable procedures and treating other patients. Critical care was necessary to treat or prevent imminent or life-threatening deterioration.  Critical care was time spent personally by me on the following activities: development of treatment plan with patient and/or surrogate as well as nursing, discussions with consultants, evaluation of patient's response to treatment, examination of patient, obtaining history from patient or surrogate, ordering and performing treatments and interventions, ordering and review of laboratory studies, ordering and review of radiographic studies, pulse oximetry and re-evaluation of patient's condition.  Eston Hence MSN, AGACNP-BC [place] [facility] Amion for pager  06/17/2023, 9:36 AM

## 2023-06-17 NOTE — ED Notes (Signed)
 [facility] [id number]. [phone] OPTION 2

## 2023-06-17 NOTE — ED Notes (Signed)
 [facility] called and said they will not be taken this patient due to limited resources and space the earliest they may be able to take this patient but no guarantee is monday

## 2023-06-17 NOTE — ED Notes (Signed)
 Both [facility] and [facility] said they can't transport pt until the morning

## 2023-06-17 NOTE — ED Notes (Signed)
 [facility] called to transport patient

## 2023-06-17 NOTE — ED Notes (Signed)
 [facility] will be giving us  a call back with bed assignment

## 2023-06-17 NOTE — Anesthesia Procedure Notes (Signed)
 Procedure Name: Intubation Date/Time: 06/17/2023 11:58 PM  Performed by: Ariabella Brien C., CRNAPre-anesthesia Checklist: Patient identified, Emergency Drugs available, Suction available, Patient being monitored and Timeout performed Patient Re-evaluated:Patient Re-evaluated prior to induction Oxygen Delivery Method: Ambu bag Preoxygenation: Pre-oxygenation with 100% oxygen Induction Type: IV induction and Rapid sequence Ventilation: Nasal airway inserted- appropriate to patient size Laryngoscope Size: Glidescope and 3 Grade View: Grade I Tube type: Oral Tube size: 7.5 mm Number of attempts: 1 Airway Equipment and Method: Rigid stylet and Video-laryngoscopy Placement Confirmation: ETT inserted through vocal cords under direct vision, positive ETCO2, CO2 detector and breath sounds checked- equal and bilateral Secured at: 22 cm Tube secured with: Tape Dental Injury: Teeth and Oropharynx as per pre-operative assessment  Difficulty Due To: Difficulty was anticipated, Difficult Airway-  due to edematous airway, Difficult Airway- due to limited oral opening and Difficult Airway- due to large tongue Comments: Angioedema

## 2023-06-17 NOTE — ED Provider Notes (Signed)
 Patient signed out to me by previous provider. Please refer to their note for full HPI.  Briefly this is a 43 year old female who was seen yesterday for a large dental abscess, intubated for airway protection in anticipation of transfer.  Initially patient was accepted to [facility] in [place].  However they called this morning and stated they no longer can accept the patient.  Patient had already been discussed to [facility] and [facility].  I consulted ICU for the potential that she would be intubated here.  Reached back out to [facility] and reconsulted the day oral surgeon.  Given that she is now intubated this changes the urgency of things.  They do have an ICU bed that is opening up and they have accepted her for transfer and plan for surgery.  Patient otherwise remained stable on the vent, ICU has been helping us  manage vent settings.  She is currently sedated with stable vitals.  Patient will be transferred to [facility] for definitive treatment.   Flonnie Humphrey, DO 06/17/23 2130

## 2023-06-17 NOTE — ED Notes (Signed)
Soft restraints applied to bilateral wrists.  

## 2023-10-20 ENCOUNTER — Emergency Department (HOSPITAL_COMMUNITY): Payer: MEDICAID

## 2023-10-20 ENCOUNTER — Emergency Department (HOSPITAL_COMMUNITY)
Admission: EM | Admit: 2023-10-20 | Discharge: 2023-10-21 | Disposition: A | Discharge status: Home / Self Care | Payer: MEDICAID | Attending: Emergency Medicine | Admitting: Emergency Medicine

## 2023-10-20 ENCOUNTER — Other Ambulatory Visit: Payer: Self-pay

## 2023-10-20 ENCOUNTER — Encounter (HOSPITAL_COMMUNITY): Payer: Self-pay

## 2023-10-20 DIAGNOSIS — X58XXXA Exposure to other specified factors, initial encounter: Secondary | ICD-10-CM | POA: Insufficient documentation

## 2023-10-20 DIAGNOSIS — I10 Essential (primary) hypertension: Secondary | ICD-10-CM | POA: Insufficient documentation

## 2023-10-20 DIAGNOSIS — R42 Dizziness and giddiness: Secondary | ICD-10-CM | POA: Diagnosis not present

## 2023-10-20 DIAGNOSIS — S90821A Blister (nonthermal), right foot, initial encounter: Secondary | ICD-10-CM | POA: Insufficient documentation

## 2023-10-20 DIAGNOSIS — R791 Abnormal coagulation profile: Secondary | ICD-10-CM | POA: Insufficient documentation

## 2023-10-20 LAB — CBC
HCT: 34.8 % — ABNORMAL LOW (ref 36.0–46.0)
Hemoglobin: 10.7 g/dL — ABNORMAL LOW (ref 12.0–15.0)
MCH: 26.8 pg (ref 26.0–34.0)
MCHC: 30.7 g/dL (ref 30.0–36.0)
MCV: 87 fL (ref 80.0–100.0)
Platelets: 330 K/uL (ref 150–400)
RBC: 4 MIL/uL (ref 3.87–5.11)
RDW: 14.1 % (ref 11.5–15.5)
WBC: 4.9 K/uL (ref 4.0–10.5)
nRBC: 0 % (ref 0.0–0.2)

## 2023-10-20 LAB — PROTIME-INR
INR: 1.3 — ABNORMAL HIGH (ref 0.8–1.2)
Prothrombin Time: 16.9 s — ABNORMAL HIGH (ref 11.4–15.2)

## 2023-10-20 MED ORDER — MECLIZINE HCL 25 MG PO TABS
25.0000 mg | ORAL_TABLET | Freq: Once | ORAL | Status: AC
  Administered 2023-10-20: 25 mg via ORAL
  Filled 2023-10-20: qty 1

## 2023-10-20 NOTE — ED Triage Notes (Signed)
 C/O right foot pain. Pt states she has been walking a lot. Denies recent trauma or falls.

## 2023-10-20 NOTE — Discharge Instructions (Addendum)
 It was a pleasure taking part in your care.  As discussed, you have a blister to the bottom surface of the right foot.  Please begin wearing socks and tight fitting shoes.  Please keep area covered.  This will heal on its own.  Read attached guide concerning blisters.  Follow-up with your PCP.  Take ibuprofen  every 6 hours for pain.  Return to the ED with new symptoms.  Please follow-up with your PCP.  Read attached are concerning vertigo.

## 2023-10-20 NOTE — ED Provider Notes (Signed)
 [place] EMERGENCY DEPARTMENT AT [facility] Provider Note   CSN: [id number] Arrival date & time: 10/20/23  1806     Patient presents with: No chief complaint on file.   Gina [name] is a 43 y.o. female with medical history of hypertension and migraines.  Patient presents to ED for evaluation of right foot pain.  States that for the last 1 week she has a right foot pain.  Reports that she has pain to the bottom of her foot.  Denies fevers, nausea or vomiting.  Denies history of diabetes.  Denies trauma to her foot.  Denies medication prior to arrival.  HPI     Prior to Admission medications   Medication Sig Start Date End Date Taking? Authorizing Provider  acetaminophen  (TYLENOL ) 500 MG tablet Take 1 tablet (500 mg total) by mouth every 6 (six) hours as needed. Patient not taking: Reported on 06/13/2022 02/05/22   Redwine, Madison A, PA-C  amoxicillin -clavulanate (AUGMENTIN ) 875-125 MG tablet Take 1 tablet by mouth every 12 (twelve) hours. Patient not taking: Reported on 06/13/2022 02/08/22   Melvenia Motto, MD  Aspirin-Salicylamide-Caffeine  (BC HEADACHE PO) Take 1 packet by mouth daily as needed (pain).    [provider]  ibuprofen  (ADVIL ) 800 MG tablet Take 1 tablet (800 mg total) by mouth 3 (three) times daily. Patient not taking: Reported on 06/13/2022 02/05/22   Redwine, Madison A, PA-C  lidocaine  (XYLOCAINE ) 2 % solution Use as directed 15 mLs in the mouth or throat as needed for mouth pain. Patient not taking: Reported on 06/13/2022 02/05/22   Redwine, Madison A, PA-C  naproxen  (NAPROSYN ) 500 MG tablet Take 1 tablet (500 mg total) by mouth 2 (two) times daily. Patient not taking: Reported on 06/13/2022 10/28/17   Martell Gren[place], PA-C  ondansetron  (ZOFRAN ) 4 MG tablet Take 1 tablet (4 mg total) by mouth every 6 (six) hours. Patient not taking: Reported on 06/13/2022 11/02/19   Couture, Cortni S, PA-C  potassium chloride  (KLOR-CON ) 10 MEQ tablet Take 1 tablet (10 mEq  total) by mouth daily for 4 days. 11/02/19 11/06/19  Couture, Cortni S, PA-C    Allergies: Cherry    Review of Systems  All other systems reviewed and are negative.   Updated Vital Signs BP (!) 117/93 (BP Location: Right Arm)   Pulse 78   Temp 98.9 F (37.2 C) (Oral)   Resp 16   Ht 5' 7 (1.702 m)   SpO2 100%   BMI 37.60 kg/m   Physical Exam Vitals and nursing note reviewed.  Constitutional:      General: She is not in acute distress.    Appearance: She is well-developed. She is not toxic-appearing.  HENT:     Head: Normocephalic and atraumatic.  Eyes:     Conjunctiva/sclera: Conjunctivae normal.  Cardiovascular:     Rate and Rhythm: Normal rate and regular rhythm.     Heart sounds: No murmur heard. Pulmonary:     Effort: Pulmonary effort is normal. No respiratory distress.     Breath sounds: Normal breath sounds.  Abdominal:     Palpations: Abdomen is soft.     Tenderness: There is no abdominal tenderness.  Musculoskeletal:        General: No swelling.     Cervical back: Neck supple.       Feet:  Skin:    General: Skin is warm and dry.     Capillary Refill: Capillary refill takes less than 2 seconds.  Coloration: Skin is not jaundiced or pale.     Comments: Blister to bottom of foot at above circumscribed area.  No induration, fluctuance.  No drainage.  No erythema, warmth.  No deformity.  2+ DP pulse in the right foot.  Neurological:     Mental Status: She is alert and oriented to person, place, and time.  Psychiatric:        Mood and Affect: Mood normal.        Behavior: Behavior normal.     (all labs ordered are listed, but only abnormal results are displayed) Labs Reviewed  PROTIME-INR - Abnormal; Notable for the following components:      Result Value   Prothrombin Time 16.9 (*)    INR 1.3 (*)    All other components within normal limits  CBC - Abnormal; Notable for the following components:   Hemoglobin 10.7 (*)    HCT 34.8 (*)    All other  components within normal limits  BASIC METABOLIC PANEL WITH GFR  ETHANOL    EKG: EKG Interpretation Date/Time:  Friday October 20 2023 23:54:23 EDT Ventricular Rate:  54 PR Interval:  156 QRS Duration:  78 QT Interval:  444 QTC Calculation: 421 R Axis:   6  Text Interpretation: Sinus bradycardia with sinus arrhythmia Low voltage QRS Confirmed by Nettie, April ([id number]) on 10/21/2023 12:01:17 AM  Radiology: CT Angio Head W or Wo Contrast Result Date: 10/21/2023 EXAM: CTA Head With and Without Intravenous Contrast. CT Head without Contrast. CLINICAL HISTORY: Headache, sudden, severe. TECHNIQUE: Axial CTA images of the head with and without intravenous contrast. Three-dimensional MIP/volume rendered reformations were performed. Axial computed tomography images of the head/brain performed without intravenous contrast. Dose reduction technique was used including one or more of the following: automated exposure control, adjustment of mA and kV according to patient size, and/or iterative reconstruction. CONTRAST: 80 mL iohexol  (OMNIPAQUE ) 350 MG/ML injection. COMPARISON: 06/10/2022 FINDINGS: CT HEAD: BRAIN: No acute intraparenchymal hemorrhage. No mass Lesion. No CT evidence for acute territorial infarct. No midline shift or extra-axial collection. VENTRICLES: No hydrocephalus. ORBITS: The orbits are unremarkable. SINUSES AND MASTOIDS: The paranasal sinuses and mastoid air cells are clear CTA HEAD: INTERNAL CAROTID ARTERIES: The intracranial ICAs are patent with no significant stenosis. No occlusion. No aneurysm. ANTERIOR CEREBRAL ARTERIES: No significant stenosis. No occlusion. No aneurysm. MIDDLE CEREBRAL ARTERIES: No significant stenosis. No occlusion. No aneurysm. POSTERIOR CEREBRAL ARTERIES: No significant stenosis. No occlusion. No aneurysm. BASILAR ARTERY: No significant stenosis. No occlusion. No aneurysm. VERTEBRAL ARTERIES: No significant stenosis. No occlusion. No aneurysm. OTHER: SOFT TISSUES:  No acute finding. No masses or lymphadenopathy. BONES: No acute osseous abnormality. IMPRESSION: 1. No acute intracranial hemorrhage or ischemic change. 2. No intracranial arterial stenosis, aneurysm, or dissection. Electronically signed by: Franky Stanford MD 10/21/2023 01:29 AM EDT RP Workstation: [id number]   DG Foot Complete Right Result Date: 10/20/2023 CLINICAL DATA:  pain EXAM: RIGHT FOOT COMPLETE - 3+ VIEW COMPARISON:  None Available. FINDINGS: Hallux valgus deformity.No acute fracture or dislocation. Os naviculare. There is no evidence of arthropathy or other focal bone abnormality. Soft tissues are unremarkable. No radiopaque foreign body. IMPRESSION: No acute fracture or dislocation. Electronically Signed   By: Rogelia Myers M.D.   On: 10/20/2023 18:46    Procedures   Medications Ordered in the ED  meclizine  (ANTIVERT ) tablet 25 mg (25 mg Oral Given 10/20/23 2309)  iohexol  (OMNIPAQUE ) 350 MG/ML injection 80 mL (80 mLs Intravenous Contrast Given 10/21/23 0103)  Medical Decision Making Amount and/or Complexity of Data Reviewed Labs: ordered. Radiology: ordered. ECG/medicine tests: ordered.  Risk Prescription drug management.   43 year old female presents for evaluation of right plantar foot pain.  On exam, the patient does have a blister to her right plantar surface area where she reports pain is.  She has no drainage, erythema or warmth to this place.  She has no fluctuance, no concern of abscess.  She has full ROM of her right ankle.  She has 2+ DP pulse of the right foot.  She has no deformity.  She has brisk cap refill.  X-ray imaging collected in triage was unremarkable.  Patient was advised to begin wearing formfitting shoes, socks.  Advised to cover area and allowed to heal.  Was encouraged to follow-up with her PCP.  Encouraged to take ibuprofen  for pain.  Stable to discharge home.  Update: While attempting to discharge patient, she reports to me that she has had 1 month  of dizziness as well as blurred vision.  She reports that this dizziness and blurred vision has been persistent however did resolve this evening prior to coming to the ED.  She reports that she walked to the ED this evening.  She states that she often gets dizzy and she attributed to her vertigo.  She denies any new features to her dizziness tonight.  She denies headache, neck pain.  She reports that she did have blurred vision last week denies any upper division tonight.  Reports headache last week but denies any headache tonight.  Denies one-sided weakness, numbness.  Denies chest pain or shortness of breath.  Will assess with labs, CT angiogram head, EKG.  Will provide with meclizine .  Patient labs have resulted.  Patient CBC without leukocytosis, baseline hemoglobin.  Ethanol undetectable.  BMP grossly unremarkable.  CTA negative.  She denies any current dizziness after meclizine .  EKG is nonischemic.  Patient will be discharged home at this time.  Advised to follow-up with PCP.  Stable to discharge.    Final diagnoses:  Blister of plantar aspect of right foot, initial encounter  Dizziness    ED Discharge Orders     None          Ruthell Lonni JULIANNA DEVONNA 10/21/23 0159    Palumbo, April, MD 10/21/23 0211

## 2023-10-20 NOTE — ED Triage Notes (Signed)
 Pt came in via POV d/t Rt foot pain the last week, denies any recent fall/injury. Endorses getting stung by a bee in that foot on August 31st recently.

## 2023-10-20 NOTE — ED Provider Notes (Incomplete)
 [place] EM[facility]GENC[facility] DEPARTMENT AT [facility] Provider Note   CSN: [id number] Arrival date & time: 10/20/23  1806     Patient presents with: No chief complaint on file.   Gina [name] is a 43 y.o. female with medical history of hypertension and migraines.  Patient presents to ED for evaluation of right foot pain.  States that for the last 1 week she has a right foot pain.  Reports that she has pain to the bottom of her foot.  Denies fevers, nausea or vomiting.  Denies history of diabetes.  Denies trauma to her foot.  Denies medication prior to arrival.  HPI     Prior to Admission medications   Medication Sig Start Date End Date Taking? Authorizing Provider  acetaminophen  (TYLENOL ) 500 MG tablet Take 1 tablet (500 mg total) by mouth every 6 (six) hours as needed. Patient not taking: Reported on 06/13/2022 02/05/22   Redwine, Madison A, PA-C  amoxicillin -clavulanate (AUGMENTIN ) 875-125 MG tablet Take 1 tablet by mouth every 12 (twelve) hours. Patient not taking: Reported on 06/13/2022 02/08/22   Melvenia Motto, MD  Aspirin-Salicylamide-Caffeine  [facility] HEADACHE PO) Take 1 packet by mouth daily as needed (pain).    [provider]  ibuprofen  (ADVIL ) 800 MG tablet Take 1 tablet (800 mg total) by mouth 3 (three) times daily. Patient not taking: Reported on 06/13/2022 02/05/22   Redwine, Madison A, PA-C  lidocaine  (XYLOCAINE ) 2 % solution Use as directed 15 mLs in the mouth or throat as needed for mouth pain. Patient not taking: Reported on 06/13/2022 02/05/22   Redwine, Madison A, PA-C  naproxen  (NAPROSYN ) 500 MG tablet Take 1 tablet (500 mg total) by mouth 2 (two) times daily. Patient not taking: Reported on 06/13/2022 10/28/17   Wurst, [place], PA-C  ondansetron  (ZOFRAN ) 4 MG tablet Take 1 tablet (4 mg total) by mouth every 6 (six) hours. Patient not taking: Reported on 06/13/2022 11/02/19   Couture, Cortni S, PA-C  potassium chloride  (KLOR-CON ) 10 MEQ tablet Take 1 tablet (10 mEq  total) by mouth daily for 4 days. 11/02/19 11/06/19  Couture, Cortni S, PA-C    Allergies: Cherry    Review of Systems  All other systems reviewed and are negative.   Updated Vital Signs BP (!) 117/93 (BP Location: Right Arm)   Pulse 78   Temp 98.9 F (37.2 C) (Oral)   Resp 16   Ht 5' 7 (1.702 m)   SpO2 100%   BMI 37.60 kg/m   Physical Exam Vitals and nursing note reviewed.  Constitutional:      General: She is not in acute distress.    Appearance: She is well-developed. She is not toxic-appearing.  HENT:     Head: Normocephalic and atraumatic.  Eyes:     Conjunctiva/sclera: Conjunctivae normal.  Cardiovascular:     Rate and Rhythm: Normal rate and regular rhythm.     Heart sounds: No murmur heard. Pulmonary:     Effort: Pulmonary effort is normal. No respiratory distress.     Breath sounds: Normal breath sounds.  Abdominal:     Palpations: Abdomen is soft.     Tenderness: There is no abdominal tenderness.  Musculoskeletal:        General: No swelling.     Cervical back: Neck supple.       Feet:  Skin:    General: Skin is warm and dry.     Capillary Refill: Capillary refill takes less than 2 seconds.  Coloration: Skin is not jaundiced or pale.     Comments: Blister to bottom of foot at above circumscribed area.  No induration, fluctuance.  No drainage.  No erythema, warmth.  No deformity.  2+ DP pulse in the right foot.  Neurological:     Mental Status: She is alert and oriented to person, place, and time.  Psychiatric:        Mood and Affect: Mood normal.        Behavior: Behavior normal.     (all labs ordered are listed, but only abnormal results are displayed) Labs Reviewed - No data to display  EKG: None  Radiology: DG Foot Complete Right Result Date: 10/20/2023 CLINICAL DATA:  pain EXAM: RIGHT FOOT COMPLETE - 3+ VIEW COMPARISON:  None Available. FINDINGS: Hallux valgus deformity.No acute fracture or dislocation. Os naviculare. There is no  evidence of arthropathy or other focal bone abnormality. Soft tissues are unremarkable. No radiopaque foreign body. IMPRESSION: No acute fracture or dislocation. Electronically Signed   By: Rogelia Myers M.D.   On: 10/20/2023 18:46    Procedures   Medications Ordered in the ED - No data to display   Medical Decision Making Amount and/or Complexity of Data Reviewed Radiology: ordered.   43 year old female presents for evaluation of right plantar foot pain.  On exam, the patient does have a blister to her right plantar surface area where she reports pain is.  She has no drainage, erythema or warmth to this place.  She has no fluctuance, no concern of abscess.  She has full ROM of her right ankle.  She has 2+ DP pulse of the right foot.  She has no deformity.  She has brisk cap refill.  X-ray imaging collected in triage was unremarkable.  Patient was advised to begin wearing formfitting shoes, socks.  Advised to cover area and allowed to heal.  Was encouraged to follow-up with her PCP.  Encouraged to take ibuprofen  for pain.  Stable to discharge home.  Update: While attempting to discharge patient, she reports to me that she has had 1 month of dizziness as well as blurred vision.  She reports that this dizziness and blurred vision has been persistent however did resolve this evening prior to coming to the ED.  She reports that she walked to the ED this evening.  She states that she often gets dizzy and she attributed to her vertigo.  She denies any new features to her dizziness tonight.  She denies headache, neck pain.  She reports that she did have blurred vision last week denies any upper division tonight.  Reports headache last week but denies any headache tonight.  Denies one-sided weakness, numbness.  Denies chest pain or shortness of breath.  Will assess with labs, CT angiogram head, EKG.  Will provide with meclizine .    Final diagnoses:  Blister of plantar aspect of right foot, initial  encounter    ED Discharge Orders     None

## 2023-10-21 ENCOUNTER — Emergency Department (HOSPITAL_COMMUNITY): Payer: MEDICAID

## 2023-10-21 LAB — BASIC METABOLIC PANEL WITH GFR
Anion gap: 11 (ref 5–15)
BUN: 9 mg/dL (ref 6–20)
CO2: 23 mmol/L (ref 22–32)
Calcium: 9.2 mg/dL (ref 8.9–10.3)
Chloride: 103 mmol/L (ref 98–111)
Creatinine, Ser: 0.77 mg/dL (ref 0.44–1.00)
GFR, Estimated: >60 mL/min (ref >60)
Glucose, Bld: 84 mg/dL (ref 70–99)
Potassium: 3.6 mmol/L (ref 3.5–5.1)
Sodium: 137 mmol/L (ref 135–145)

## 2023-10-21 LAB — ETHANOL: Alcohol, Ethyl (B): <15 mg/dL (ref <15)

## 2023-10-21 MED ORDER — IOHEXOL 350 MG/ML SOLN
80.0000 mL | Freq: Once | INTRAVENOUS | Status: AC | PRN
  Administered 2023-10-21: 80 mL via INTRAVENOUS

## 2023-10-21 NOTE — ED Notes (Signed)
 Patient transported to CT
